# Patient Record
Sex: Male | Born: 1965 | Race: White | Hispanic: No | Marital: Married
Health system: Southern US, Community
[De-identification: ages and names within clinical notes are randomized; demographics above are authoritative.]

## PROBLEM LIST (undated history)

## (undated) DIAGNOSIS — L570 Actinic keratosis: Secondary | ICD-10-CM

## (undated) HISTORY — DX: Actinic keratosis: L57.0

---

## 2005-08-21 ENCOUNTER — Ambulatory Visit: Payer: Self-pay | Admitting: Urology

## 2006-06-19 ENCOUNTER — Ambulatory Visit: Payer: Self-pay | Admitting: Urology

## 2006-07-26 ENCOUNTER — Emergency Department: Payer: Self-pay | Admitting: Emergency Medicine

## 2007-01-15 ENCOUNTER — Ambulatory Visit: Payer: Self-pay | Admitting: Urology

## 2007-10-10 ENCOUNTER — Ambulatory Visit: Payer: Self-pay | Admitting: Urology

## 2008-10-06 ENCOUNTER — Ambulatory Visit: Payer: Self-pay | Admitting: Urology

## 2009-10-12 ENCOUNTER — Ambulatory Visit: Payer: Self-pay | Admitting: Urology

## 2009-12-30 ENCOUNTER — Emergency Department: Payer: Self-pay | Admitting: Emergency Medicine

## 2010-02-20 ENCOUNTER — Ambulatory Visit: Payer: Self-pay | Admitting: Urology

## 2010-03-30 ENCOUNTER — Ambulatory Visit: Payer: Self-pay | Admitting: Urology

## 2010-04-06 ENCOUNTER — Ambulatory Visit: Payer: Self-pay | Admitting: Urology

## 2010-10-16 ENCOUNTER — Ambulatory Visit: Payer: Self-pay | Admitting: Urology

## 2011-08-08 ENCOUNTER — Ambulatory Visit: Payer: Self-pay | Admitting: Urology

## 2011-08-23 ENCOUNTER — Ambulatory Visit: Payer: Self-pay | Admitting: Urology

## 2011-09-06 ENCOUNTER — Ambulatory Visit: Payer: Self-pay | Admitting: Urology

## 2011-11-01 ENCOUNTER — Ambulatory Visit: Payer: Self-pay | Admitting: Urology

## 2011-11-22 ENCOUNTER — Ambulatory Visit: Payer: Self-pay | Admitting: Urology

## 2012-03-17 ENCOUNTER — Ambulatory Visit: Payer: Self-pay | Admitting: Urology

## 2012-10-10 ENCOUNTER — Ambulatory Visit: Payer: Self-pay | Admitting: Urology

## 2012-10-23 ENCOUNTER — Ambulatory Visit: Payer: Self-pay | Admitting: Urology

## 2012-11-04 ENCOUNTER — Ambulatory Visit: Payer: Self-pay | Admitting: Urology

## 2013-05-21 ENCOUNTER — Ambulatory Visit: Payer: Self-pay | Admitting: Urology

## 2013-08-10 IMAGING — CR DG ABDOMEN 1V
1 series · 2 of 2 positions shown · non-contrast
Comparison: none

REASON FOR EXAM: Calculus
COMMENTS:

PROCEDURE:     DXR - DXR KIDNEY URETER BLADDER  - October 23, 2012  [DATE]
RESULT:     Comparison: 10/10/2012

[Series 1: supine kub · 0.17mm/px · 2 of 2 slices shown]
[im 1/2]
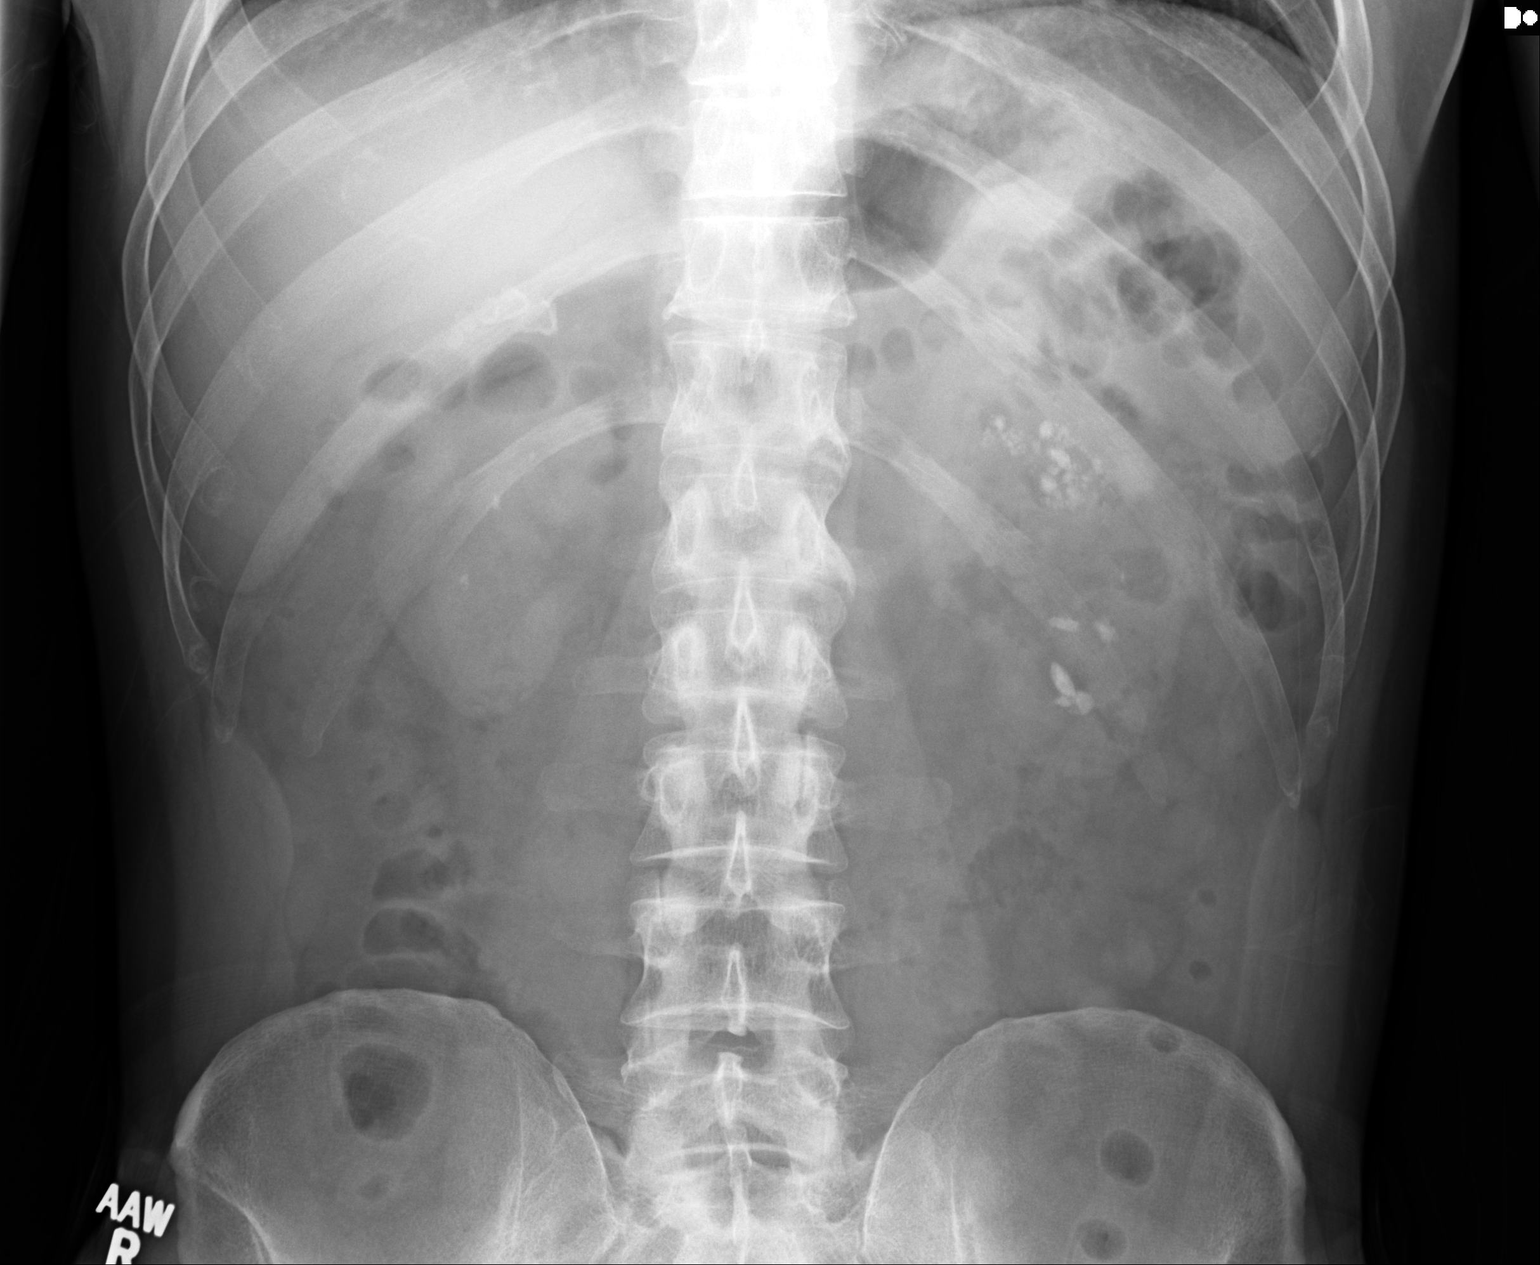
[im 2/2]
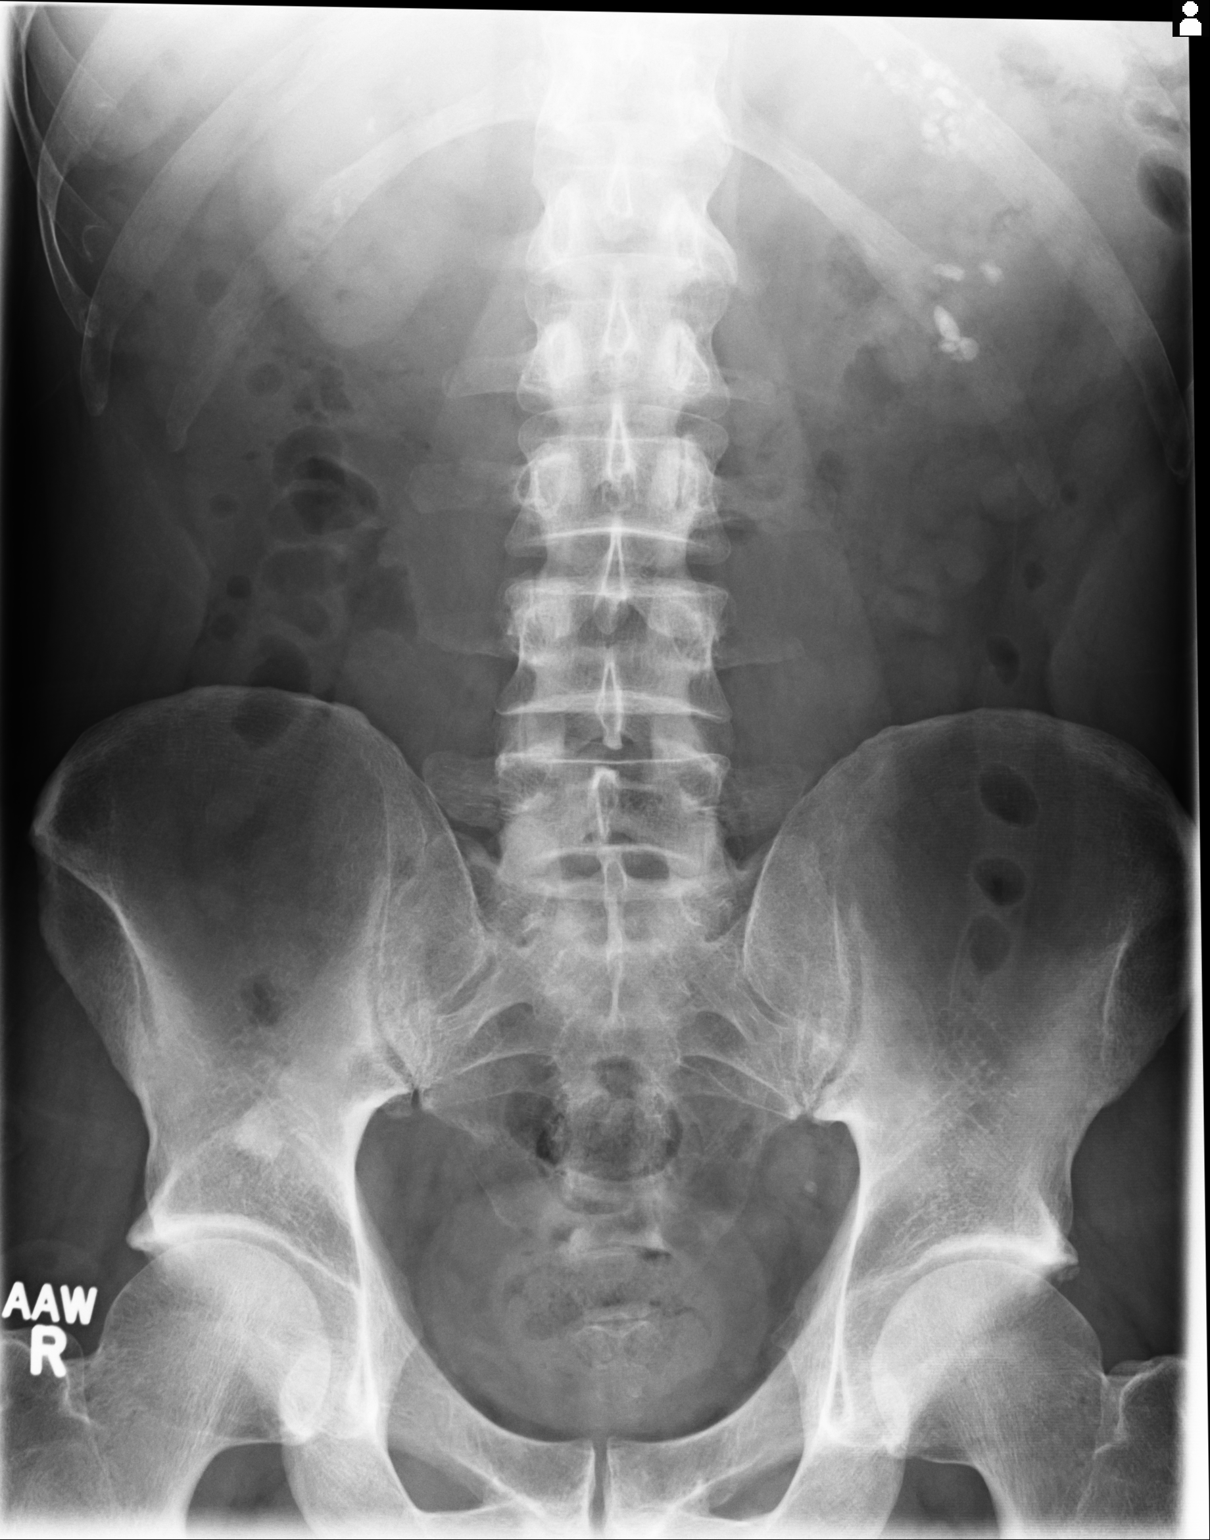

[2 of 2 positions shown; findings below may reference images not displayed]

FINDINGS: Multiple calculi seen overlying the kidneys, left greater than right. There
is one calculus in the region of the left renal pelvis on the prior
examination which is not definitely appreciated on today's examination. The
are calcified gallstones. Calcification in the left hemipelvis likely
represents a phlebolith. Sclerotic density along the right iliac wing likely
represents a bone island.
IMPRESSION: Bilateral nephrolithiasis.

[REDACTED]

## 2013-09-23 ENCOUNTER — Ambulatory Visit: Payer: Self-pay | Admitting: Urology

## 2014-03-08 IMAGING — CR DG ABDOMEN 1V
1 series · 2 of 2 positions shown · non-contrast
Comparison: 10/10/2012

CLINICAL DATA: History of nephrolithiasis since 2595. Left-sided
renal colic

EXAM:
ABDOMEN - 1 VIEW

[Series 1: t abdomen supine · 0.14mm/px · 2 of 2 slices shown]
[im 1/2]
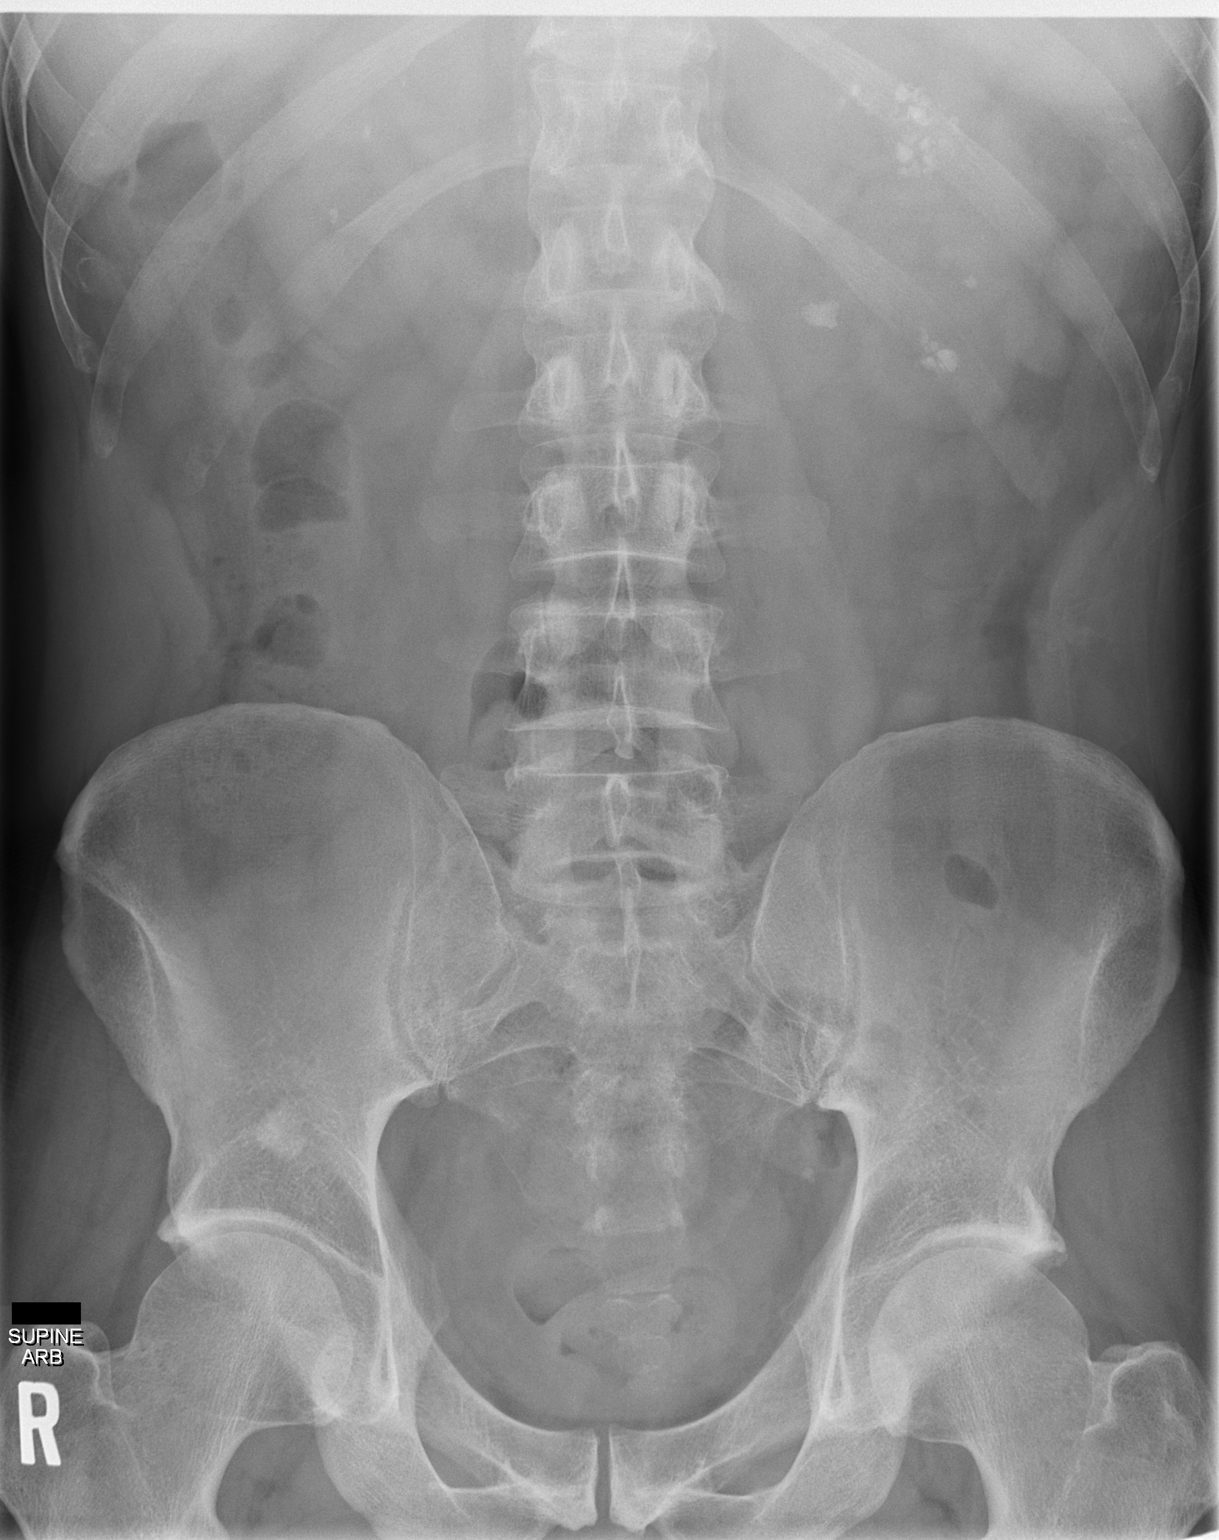
[im 2/2]
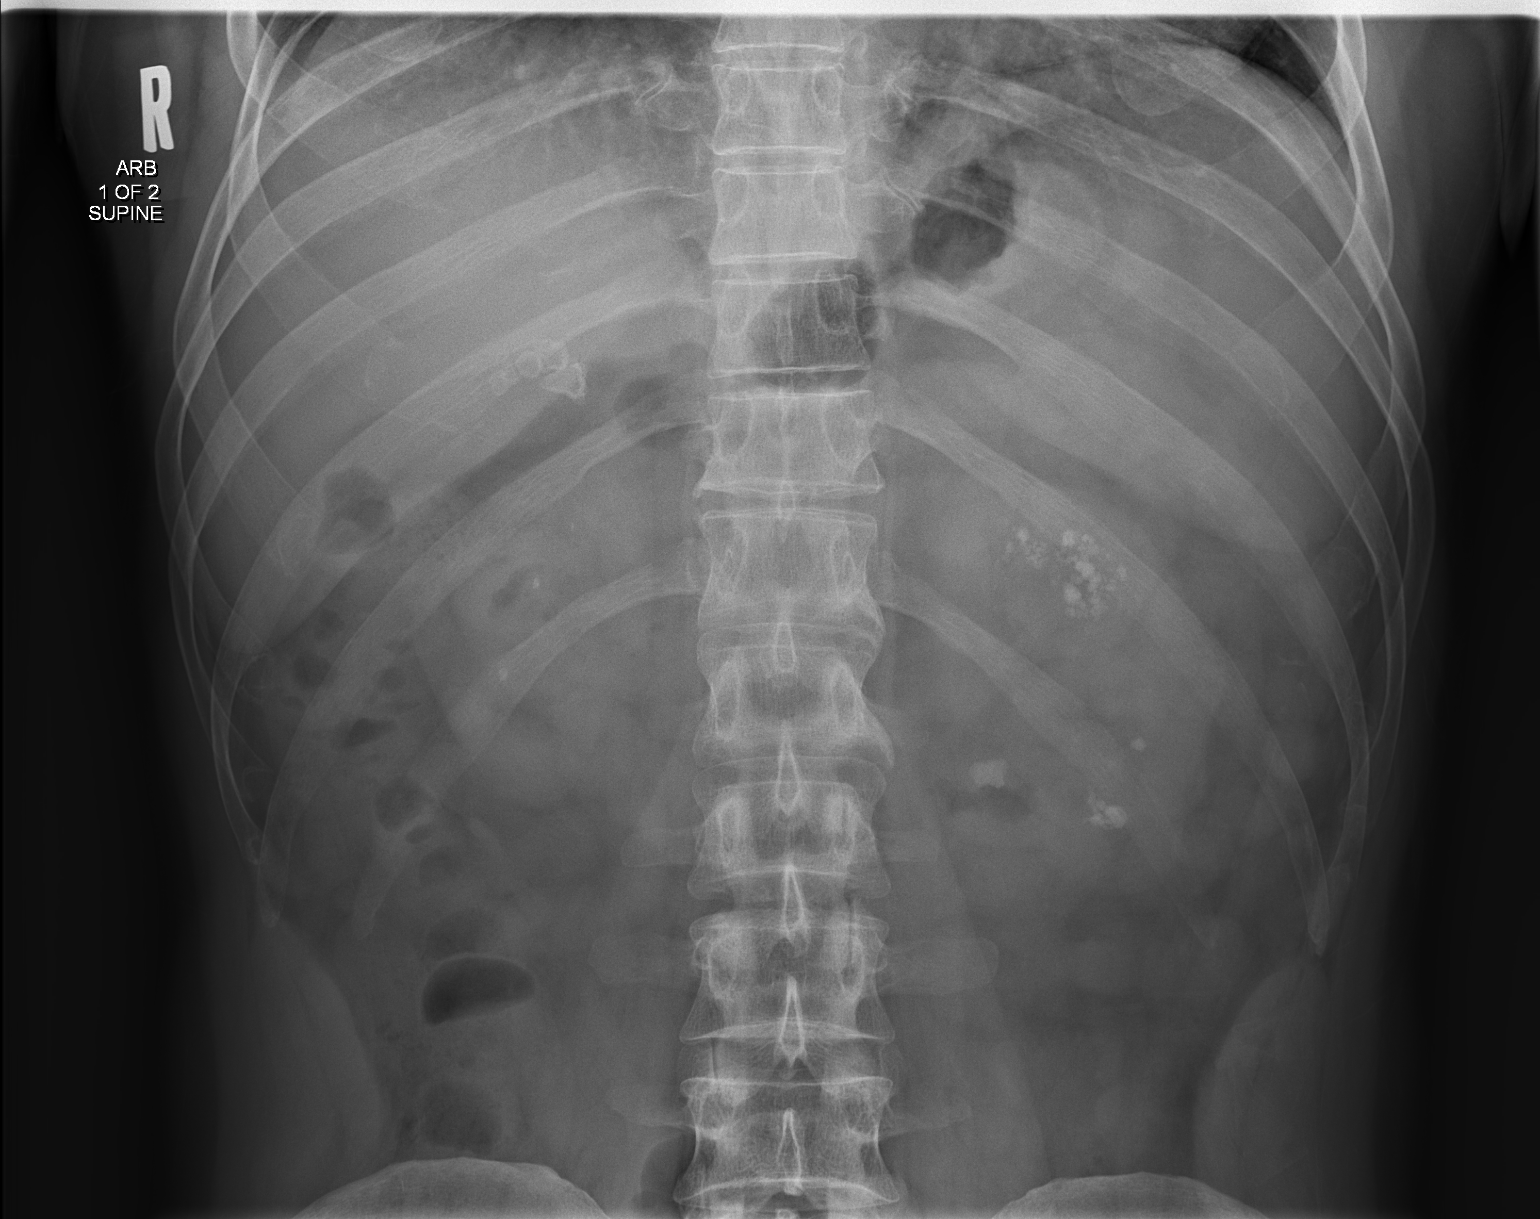

[2 of 2 positions shown; findings below may reference images not displayed]

FINDINGS: The bowel gas pattern is normal. Multiple bilateral renal calculi
are again identified. The majority of these are unchanged in size
and appearance. One calculus positioned in the region of the left
renal pelvis today measures 11 mm as compared with the prior exam in
Tuesday October, 2012 at which time it measured 6.3 mm.

No findings suggesting left ureteral radiopaque calculi are
otherwise seen. A stable right iliac bone island and left pelvic
phlebolith are identified. Bony structures are intact.
IMPRESSION: Increasing size of a left calculus positioned in the expected region
of the left renal pelvis. If clinical concern for the possibility of
an obstructive process at the left UPJ exists as a result of the
increasing size of the stone, further assessment with unenhanced
renal CT would be recommended. Otherwise unchanged multiple
bilateral intrarenal calculi. No ureteral calculi seen on either
side.

## 2014-06-10 ENCOUNTER — Ambulatory Visit: Payer: Self-pay | Admitting: Unknown Physician Specialty

## 2020-01-20 ENCOUNTER — Ambulatory Visit: Payer: BC Managed Care – PPO | Admitting: Dermatology

## 2020-01-20 ENCOUNTER — Other Ambulatory Visit: Payer: Self-pay

## 2020-01-20 DIAGNOSIS — L570 Actinic keratosis: Secondary | ICD-10-CM | POA: Diagnosis not present

## 2020-01-20 DIAGNOSIS — D229 Melanocytic nevi, unspecified: Secondary | ICD-10-CM | POA: Diagnosis not present

## 2020-01-20 DIAGNOSIS — Z1283 Encounter for screening for malignant neoplasm of skin: Secondary | ICD-10-CM | POA: Diagnosis not present

## 2020-01-20 DIAGNOSIS — D2339 Other benign neoplasm of skin of other parts of face: Secondary | ICD-10-CM | POA: Diagnosis not present

## 2020-01-20 DIAGNOSIS — L814 Other melanin hyperpigmentation: Secondary | ICD-10-CM

## 2020-01-20 DIAGNOSIS — D1800 Hemangioma unspecified site: Secondary | ICD-10-CM

## 2020-01-20 DIAGNOSIS — L821 Other seborrheic keratosis: Secondary | ICD-10-CM

## 2020-01-20 DIAGNOSIS — D2239 Melanocytic nevi of other parts of face: Secondary | ICD-10-CM

## 2020-01-20 DIAGNOSIS — L578 Other skin changes due to chronic exposure to nonionizing radiation: Secondary | ICD-10-CM

## 2020-01-20 NOTE — Patient Instructions (Addendum)

## 2020-01-20 NOTE — Progress Notes (Signed)
   Follow-Up Visit   Subjective  Billy Cruz is a 54 y.o. male who presents for the following: Annual Exam (1 year mole check )  The patient presents for Total-Body Skin Exam (TBSE) for skin cancer screening and mole check.  The following portions of the chart were reviewed this encounter and updated as appropriate:  Allergies  Meds  Problems  Med Hx  Surg Hx  Fam Hx     Review of Systems:  No other skin or systemic complaints except as noted in HPI or Assessment and Plan.  Objective  Well appearing patient in no apparent distress; mood and affect are within normal limits.  A full examination was performed including scalp, head, eyes, ears, nose, lips, neck, chest, axillae, abdomen, back, buttocks, bilateral upper extremities, bilateral lower extremities, hands, feet, fingers, toes, fingernails, and toenails. All findings within normal limits unless otherwise noted below.  Objective  L alar rim: 0.2 cm flesh papule   Objective  R ear x 1: Erythematous thin papules/macules with gritty scale.    Assessment & Plan  Fibrous papule of nose L alar rim  Benign-appearing.  Observation.  Call clinic for new or changing moles.  Recommend daily use of broad spectrum spf 30+ sunscreen to sun-exposed areas.    AK (actinic keratosis) R ear x 1  Destruction of lesion - R ear x 1 Complexity: simple   Destruction method: cryotherapy   Informed consent: discussed and consent obtained   Timeout:  patient name, date of birth, surgical site, and procedure verified Lesion destroyed using liquid nitrogen: Yes   Region frozen until ice ball extended beyond lesion: Yes   Outcome: patient tolerated procedure well with no complications   Post-procedure details: wound care instructions given    Skin cancer screening   Lentigines - Scattered tan macules - Discussed due to sun exposure - Benign, observe - Call for any changes  Seborrheic Keratoses - Stuck-on, waxy, tan-brown  papules and plaques  - Discussed benign etiology and prognosis. - Observe - Call for any changes  Melanocytic Nevi - Tan-brown and/or pink-flesh-colored symmetric macules and papules - Benign appearing on exam today - Observation - Call clinic for new or changing moles - Recommend daily use of broad spectrum spf 30+ sunscreen to sun-exposed areas.   Hemangiomas - Red papules - Discussed benign nature - Observe - Call for any changes  Actinic Damage - diffuse scaly erythematous macules with underlying dyspigmentation - Recommend daily broad spectrum sunscreen SPF 30+ to sun-exposed areas, reapply every 2 hours as needed.  - Call for new or changing lesions.  Skin cancer screening performed today.  Return in about 1 year (around 01/19/2021) for TBSE .  IMarye Round, CMA, am acting as scribe for Sarina Ser, MD .  Documentation: I have reviewed the above documentation for accuracy and completeness, and I agree with the above.  Sarina Ser, MD

## 2020-01-26 ENCOUNTER — Encounter: Payer: Self-pay | Admitting: Dermatology

## 2021-01-19 ENCOUNTER — Other Ambulatory Visit: Payer: Self-pay

## 2021-01-19 ENCOUNTER — Ambulatory Visit: Payer: BC Managed Care – PPO | Admitting: Dermatology

## 2021-01-19 DIAGNOSIS — Z1283 Encounter for screening for malignant neoplasm of skin: Secondary | ICD-10-CM | POA: Diagnosis not present

## 2021-01-19 DIAGNOSIS — D229 Melanocytic nevi, unspecified: Secondary | ICD-10-CM

## 2021-01-19 DIAGNOSIS — D2239 Melanocytic nevi of other parts of face: Secondary | ICD-10-CM

## 2021-01-19 DIAGNOSIS — L578 Other skin changes due to chronic exposure to nonionizing radiation: Secondary | ICD-10-CM

## 2021-01-19 DIAGNOSIS — D235 Other benign neoplasm of skin of trunk: Secondary | ICD-10-CM

## 2021-01-19 DIAGNOSIS — C4362 Malignant melanoma of left upper limb, including shoulder: Secondary | ICD-10-CM

## 2021-01-19 DIAGNOSIS — D489 Neoplasm of uncertain behavior, unspecified: Secondary | ICD-10-CM

## 2021-01-19 DIAGNOSIS — L259 Unspecified contact dermatitis, unspecified cause: Secondary | ICD-10-CM | POA: Diagnosis not present

## 2021-01-19 DIAGNOSIS — B354 Tinea corporis: Secondary | ICD-10-CM | POA: Diagnosis not present

## 2021-01-19 DIAGNOSIS — L814 Other melanin hyperpigmentation: Secondary | ICD-10-CM

## 2021-01-19 DIAGNOSIS — D1800 Hemangioma unspecified site: Secondary | ICD-10-CM

## 2021-01-19 DIAGNOSIS — D239 Other benign neoplasm of skin, unspecified: Secondary | ICD-10-CM

## 2021-01-19 DIAGNOSIS — R21 Rash and other nonspecific skin eruption: Secondary | ICD-10-CM

## 2021-01-19 DIAGNOSIS — L821 Other seborrheic keratosis: Secondary | ICD-10-CM

## 2021-01-19 DIAGNOSIS — C439 Malignant melanoma of skin, unspecified: Secondary | ICD-10-CM

## 2021-01-19 HISTORY — DX: Malignant melanoma of skin, unspecified: C43.9

## 2021-01-19 NOTE — Progress Notes (Signed)
Follow-Up Visit   Subjective  Billy Cruz is a 55 y.o. male who presents for the following: Annual Exam (Patient here today for full body exam. He reports 2 weeks ago he went on trip and came back with spot at left forearm that is itchy and red. He has been apply lotrimine to affected area but it has not resolved. Patient reports another red mark at left forearm that he noticed 2 days ago. ).  Patient here for full body skin exam and skin cancer screening.  The following portions of the chart were reviewed this encounter and updated as appropriate:  Allergies  Meds  Problems  Med Hx  Surg Hx  Fam Hx     Objective  Well appearing patient in no apparent distress; mood and affect are within normal limits.  A full examination was performed including scalp, head, eyes, ears, nose, lips, neck, chest, axillae, abdomen, back, buttocks, bilateral upper extremities, bilateral lower extremities, hands, feet, fingers, toes, fingernails, and toenails. All findings within normal limits unless otherwise noted below.  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   Apexicon sample given to use at rash at left forearm twice daily.   Lot [id number]  Exp 01/2021  Neoplasm of uncertain behavior left mid dorsal forearm Epidermal / dermal shaving  Lesion diameter (cm):  1.1 Informed consent: discussed and consent obtained   Timeout: patient name, date of birth, surgical site, and procedure verified   Procedure prep:  Patient was prepped and draped in usual sterile fashion Prep type:  Isopropyl alcohol Anesthesia: the lesion was anesthetized in a standard fashion   Anesthetic:  0.5% bupivicaine w/ epinephrine 1-100,000 local  infiltration Instrument used: flexible razor blade   Hemostasis achieved with: pressure, aluminum chloride and electrodesiccation   Outcome: patient tolerated procedure well   Post-procedure details: sterile dressing applied and wound care instructions given   Dressing type: bandage and petrolatum    Specimen 1 - Surgical pathology Differential Diagnosis: R/o isk vs ca   Check Margins: No R/o isk vs ca   Fibrous papule of nose right alar rim Benign-appearing.  Observation.  Call clinic for new or changing lesions.  Recommend daily use of broad spectrum spf 30+ sunscreen to sun-exposed areas.   Blue nevus left buttocks Blue nevus vs vascular lesion Benign-appearing.  Observation.  Call clinic for new or changing lesions.  Recommend daily use of broad spectrum spf 30+ sunscreen to sun-exposed areas.   Skin cancer screening  Lentigines - Scattered tan macules - Due to sun exposure - Benign-appering, observe - Recommend daily broad spectrum sunscreen SPF 30+ to sun-exposed areas, reapply every 2 hours as needed. - Call for any changes  Seborrheic Keratoses - Stuck-on, waxy, tan-brown papules and/or plaques  - Benign-appearing - Discussed benign etiology and prognosis. - Observe - Call for any changes  Melanocytic Nevi - Tan-brown and/or pink-flesh-colored symmetric macules and papules - Benign appearing on exam today - Observation - Call clinic for new or changing moles - Recommend daily use of broad spectrum spf 30+ sunscreen to sun-exposed areas.   Hemangiomas - Red papules - Discussed benign nature - Observe - Call for any changes  Actinic Damage - Chronic condition, secondary to cumulative UV/sun exposure - diffuse scaly erythematous macules with underlying dyspigmentation - Recommend daily broad spectrum sunscreen SPF 30+ to sun-exposed areas, reapply every 2 hours as needed.  - Staying in the shade or wearing long sleeves, sun glasses (UVA+UVB protection) and  wide brim hats (4-inch brim around the entire circumference of the hat) are also recommended for sun protection.  - Call for new or changing lesions.  Skin cancer screening performed today.  Return in about 1 year (around 01/19/2022) for tbse. IRuthell Rummage, CMA, am acting as scribe for Sarina Ser, MD. Documentation: I have reviewed the above documentation for accuracy and completeness, and I agree with the above.  Sarina Ser, MD

## 2021-01-19 NOTE — Patient Instructions (Addendum)
Biopsy Wound Care Instructions  Leave the original bandage on for 24 hours if possible.  If the bandage becomes soaked or soiled before that time, it is OK to remove it and examine the wound.  A small amount of post-operative bleeding is normal.  If excessive bleeding occurs, remove the bandage, place gauze over the site and apply continuous pressure (no peeking) over the area for 30 minutes. If this does not work, please call our clinic as soon as possible or page your doctor if it is after hours.   Once a day, cleanse the wound with soap and water. It is fine to shower. If a thick crust develops you may use a Q-tip dipped into dilute hydrogen peroxide (mix 1:1 with water) to dissolve it.  Hydrogen peroxide can slow the healing process, so use it only as needed.    After washing, apply petroleum jelly (Vaseline) or an antibiotic ointment if your doctor prescribed one for you, followed by a bandage.    For best healing, the wound should be covered with a layer of ointment at all times. If you are not able to keep the area covered with a bandage to hold the ointment in place, this may mean re-applying the ointment several times a day.  Continue this wound care until the wound has healed and is no longer open.   Itching and mild discomfort is normal during the healing process. However, if you develop pain or severe itching, please call our office.   If you have any discomfort, you can take Tylenol (acetaminophen) or ibuprofen as directed on the bottle. (Please do not take these if you have an allergy to them or cannot take them for another reason).  Some redness, tenderness and white or yellow material in the wound is normal healing.  If the area becomes very sore and red, or develops a thick yellow-green material (pus), it may be infected; please notify us.    If you have stitches, return to clinic as directed to have the stitches removed. You will continue wound care for 2-3 days after the stitches  are removed.   Wound healing continues for up to one year following surgery. It is not unusual to experience pain in the scar from time to time during the interval.  If the pain becomes severe or the scar thickens, you should notify the office.    A slight amount of redness in a scar is expected for the first six months.  After six months, the redness will fade and the scar will soften and fade.  The color difference becomes less noticeable with time.  If there are any problems, return for a post-op surgery check at your earliest convenience.  To improve the appearance of the scar, you can use silicone scar gel, cream, or sheets (such as Mederma or Serica) every night for up to one year. These are available over the counter (without a prescription).  Please call our office at [phone] for any questions or concerns.       Melanoma ABCDEs  Melanoma is the most dangerous type of skin cancer, and is the leading cause of death from skin disease.  You are more likely to develop melanoma if you: Have light-colored skin, light-colored eyes, or red or blond hair Spend a lot of time in the sun Tan regularly, either outdoors or in a tanning bed Have had blistering sunburns, especially during childhood Have a close family member who has had a melanoma Have atypical moles  or large birthmarks  Early detection of melanoma is key since treatment is typically straightforward and cure rates are extremely high if we catch it early.   The first sign of melanoma is often a change in a mole or a new dark spot.  The ABCDE system is a way of remembering the signs of melanoma.  A for asymmetry:  The two halves do not match. B for border:  The edges of the growth are irregular. C for color:  A mixture of colors are present instead of an even brown color. D for diameter:  Melanomas are usually (but not always) greater than 47m - the size of a pencil eraser. E for evolution:  The spot keeps changing in  size, shape, and color.  Please check your skin once per month between visits. You can use a small mirror in front and a large mirror behind you to keep an eye on the back side or your body.   If you see any new or changing lesions before your next follow-up, please call to schedule a visit.  Please continue daily skin protection including broad spectrum sunscreen SPF 30+ to sun-exposed areas, reapplying every 2 hours as needed when you're outdoors.   Staying in the shade or wearing long sleeves, sun glasses (UVA+UVB protection) and wide brim hats (4-inch brim around the entire circumference of the hat) are also recommended for sun protection.    If you have any questions or concerns for your doctor, please call our main line at [phone] press option 4 to reach your doctor's medical assistant. If no one answers, please leave a voicemail as directed and we will return your call as soon as possible. Messages left after 4 pm will be answered the following business day.   You may also send [place] message via [place] We typically respond to MyChart messages within 1-2 business days.  For prescription refills, please ask your pharmacy to contact our office. Our fax number is [phone]  If you have an urgent issue when the clinic is closed that cannot wait until the next business day, you can page your doctor at the number below.    Please note that while we do our best to be available for urgent issues outside of office hours, we are not available 24/7.   If you have an urgent issue and are unable to reach [place] you may choose to seek medical care at your doctor's office, retail clinic, urgent care center, or emergency room.  If you have a medical emergency, please immediately call 911 or go to the emergency department.  Pager Numbers  - Dr. KNehemiah Massed [phone] - Dr. MLaurence Ferrari [phone] - Dr. SNicole Kindred [phone] In the event of inclement weather, please call our main line at  [phone] an update on the status of any delays or closures.  Dermatology Medication Tips: Please keep the boxes that topical medications come in in order to help keep track of the instructions about where and how to use these. Pharmacies typically print the medication instructions only on the boxes and not directly on the medication tubes.   If your medication is too expensive, please contact our office at [phone] 4 or send [place] message through [place]   We are unable to tell what your co-pay for medications will be in advance as this is different depending on your insurance coverage. However, we may be able to find a substitute medication at lower cost or fill  out paperwork to get insurance to cover a needed medication.   If a prior authorization is required to get your medication covered by your insurance company, please allow [place] 1-2 business days to complete this process.  Drug prices often vary depending on where the prescription is filled and some pharmacies may offer cheaper prices.  The website www.goodrx.com contains coupons for medications through different pharmacies. The prices here do not account for what the cost may be with help from insurance (it may be cheaper with your insurance), but the website can give you the price if you did not use any insurance.  - You can print the associated coupon and take it with your prescription to the pharmacy.  - You may also stop by our office during regular business hours and pick up a GoodRx coupon card.  - If you need your prescription sent electronically to a different pharmacy, notify our office through [facility] or by phone at [phone] option 4.

## 2021-01-23 ENCOUNTER — Encounter: Payer: Self-pay | Admitting: Dermatology

## 2021-01-26 ENCOUNTER — Encounter: Payer: Self-pay | Admitting: Dermatology

## 2021-01-26 ENCOUNTER — Telehealth: Payer: Self-pay

## 2021-01-26 NOTE — Telephone Encounter (Signed)
Dr. Nehemiah Massed advised patient of results and he is scheduled for [facility] 02/28/2021/hd

## 2021-01-26 NOTE — Telephone Encounter (Signed)
-----   Message from Ralene Bathe, MD sent at 01/26/2021 10:20 AM EDT ----- Diagnosis Skin , left mid dorsal forearm MALIGNANT MELANOMA MELANOMA TABLE (AJCC 8TH EDITION#) PROCEDURE: NOT SPECIFIED SPECIMEN ANATOMIC SITE: LEFT MID DORSAL FOREARM HISTOLOGIC TYPE: SUPERFICIAL SPREADING BRESLOW'S DEPTH/MAXIMUM TUMOR THICKNESS: 0.2 MM CLARK/ANATOMIC LEVEL: II MARGINS PERIPHERAL MARGINS: INVOLVED DEEP MARGIN: FREE ULCERATION: ABSENT SATELLITOSIS: ABSENT MITOTIC INDEX: <1/MM2 (0) LYMPHO-VASCULAR INVASION: ABSENT NEUROTROPISM: ABSENT TUMOR-INFILTRATING LYMPHOCYTES: NON-BRISK TUMOR REGRESSION: ABSENT LYMPH NODES (IF APPLICABLE): N/A PATHOLOGIC STAGE: PT1A COMMENT: A COMPLETE RE-EXCISION IS RECOMMENDED.  Cancer - Melanoma - invasive, but "thin" Schedule for Melanoma Matrix discussion and Surgery for Wide Local Excision Discussed with pt. By phone today. Will schedule for 02/28/21 at 2:30 pm  Discuss w pt and send original biopsy for [facility] testing (or we can discuss at next visit)

## 2021-02-28 ENCOUNTER — Ambulatory Visit: Payer: BC Managed Care – PPO | Admitting: Dermatology

## 2021-02-28 ENCOUNTER — Other Ambulatory Visit: Payer: Self-pay

## 2021-02-28 DIAGNOSIS — C439 Malignant melanoma of skin, unspecified: Secondary | ICD-10-CM

## 2021-02-28 DIAGNOSIS — C4362 Malignant melanoma of left upper limb, including shoulder: Secondary | ICD-10-CM

## 2021-02-28 MED ORDER — MUPIROCIN 2 % EX OINT
1.0000 | TOPICAL_OINTMENT | Freq: Every day | CUTANEOUS | 0 refills | Status: DC
Start: 2021-02-28 — End: 2021-06-15

## 2021-02-28 NOTE — Patient Instructions (Signed)
Wound Care Instructions  Cleanse wound gently with soap and water once a day then pat dry with clean gauze. Apply a thing coat of Petrolatum (petroleum jelly, "Vaseline") over the wound (unless you have an allergy to this). We recommend that you use a new, sterile tube of Vaseline. Do not pick or remove scabs. Do not remove the yellow or white "healing tissue" from the base of the wound.  Cover the wound with fresh, clean, nonstick gauze and secure with paper tape. You may use Band-Aids in place of gauze and tape if the would is small enough, but would recommend trimming much of the tape off as there is often too much. Sometimes Band-Aids can irritate the skin.  You should call the office for your biopsy report after 1 week if you have not already been contacted.  If you experience any problems, such as abnormal amounts of bleeding, swelling, significant bruising, significant pain, or evidence of infection, please call the office immediately.  FOR ADULT SURGERY PATIENTS: If you need something for pain relief you may take 1 extra strength Tylenol (acetaminophen) AND 2 Ibuprofen (200mg each) together every 4 hours as needed for pain. (do not take these if you are allergic to them or if you have a reason you should not take them.) Typically, you may only need pain medication for 1 to 3 days.   If you have any questions or concerns for your doctor, please call our main line at [phone] and press option 4 to reach your doctor's medical assistant. If no one answers, please leave a voicemail as directed and we will return your call as soon as possible. Messages left after 4 pm will be answered the following business day.   You may also send us a message via MyChart. We typically respond to MyChart messages within 1-2 business days.  For prescription refills, please ask your pharmacy to contact our office. Our fax number is [phone].  If you have an urgent issue when the clinic is closed that  cannot wait until the next business day, you can page your doctor at the number below.    Please note that while we do our best to be available for urgent issues outside of office hours, we are not available 24/7.   If you have an urgent issue and are unable to reach us, you may choose to seek medical care at your doctor's office, retail clinic, urgent care center, or emergency room.  If you have a medical emergency, please immediately call 911 or go to the emergency department.  Pager Numbers  - Dr. Kowalski: [phone]  - Dr. Moye: [phone]  - Dr. Stewart: [phone]  In the event of inclement weather, please call our main line at [phone] for an update on the status of any delays or closures.  Dermatology Medication Tips: Please keep the boxes that topical medications come in in order to help keep track of the instructions about where and how to use these. Pharmacies typically print the medication instructions only on the boxes and not directly on the medication tubes.   If your medication is too expensive, please contact our office at [phone] option 4 or send us a message through MyChart.   We are unable to tell what your co-pay for medications will be in advance as this is different depending on your insurance coverage. However, we may be able to find a substitute medication at lower cost or fill out paperwork to get insurance to cover a needed   medication.   If a prior authorization is required to get your medication covered by your insurance company, please allow us 1-2 business days to complete this process.  Drug prices often vary depending on where the prescription is filled and some pharmacies may offer cheaper prices.  The website www.goodrx.com contains coupons for medications through different pharmacies. The prices here do not account for what the cost may be with help from insurance (it may be cheaper with your insurance), but the website can give you the  price if you did not use any insurance.  - You can print the associated coupon and take it with your prescription to the pharmacy.  - You may also stop by our office during regular business hours and pick up a GoodRx coupon card.  - If you need your prescription sent electronically to a different pharmacy, notify our office through [place] MyChart or by phone at [phone] option 4.   

## 2021-02-28 NOTE — Progress Notes (Signed)
Follow-Up Visit   Subjective  Billy Cruz is a 55 y.o. 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  The following portions of the chart were reviewed this encounter and updated as appropriate:   Allergies  Meds  Problems  Med Hx  Surg Hx  Fam Hx     Review of Systems:  No other skin or systemic complaints except as noted in HPI or Assessment and Plan.  Objective  Well appearing patient in no apparent distress; mood and affect are within normal limits.  A focused examination was performed including left arm. Relevant physical exam findings are noted in the Assessment and Plan.  Left mid dorsum forearm Healing biopsy site   Assessment & Plan  Melanoma of skin ([place]) Left mid dorsum forearm  Skin excision  Lesion length (cm):  1.5 Lesion width (cm):  1.5 Margin per side (cm):  1 Total excision diameter (cm):  3.5 Informed consent: discussed and consent obtained   Timeout: patient name, date of birth, surgical site, and procedure verified   Procedure prep:  Patient was prepped and draped in usual sterile fashion Prep type:  Isopropyl alcohol and povidone-iodine Anesthesia: the lesion was anesthetized in a standard fashion   Anesthetic:  1% lidocaine w/ epinephrine 1-100,000 buffered w/ 8.4% NaHCO3 Instrument used: #15 blade   Hemostasis achieved with: pressure   Hemostasis achieved with comment:  Electrocautery Outcome: patient tolerated procedure well with no complications   Post-procedure details: sterile dressing applied and wound care instructions given   Dressing type: bandage and pressure dressing (mupirocin)    Skin repair Complexity:  Intermediate Final length (cm):  3.5 Informed consent: discussed and consent obtained   Reason for type of repair: reduce tension to allow closure, reduce the risk of dehiscence, infection, and necrosis, reduce subcutaneous dead space and  avoid a hematoma, allow closure of the large defect, preserve normal anatomy, preserve normal anatomical and functional relationships and enhance both functionality and cosmetic results   Undermining: area extensively undermined   Undermining comment:  Undermining defect 0.5cm Subcutaneous layers (deep stitches):  Suture size:  2-0 Suture type: Vicryl (polyglactin 910)   Subcutaneous suture technique: purse string. Fine/surface layer approximation (top stitches):  Suture type: nylon   Suture type comment:  Nylon Stitches: simple running   Suture removal (days):  7 Hemostasis achieved with: suture and pressure Hemostasis achieved with comment:  Electrocautery Outcome: patient tolerated procedure well with no complications   Post-procedure details: sterile dressing applied and wound care instructions given   Dressing type: bandage and pressure dressing (mupirocin)    mupirocin ointment (BACTROBAN) 2 % Apply 1 application topically daily. With dressing changes  Specimen 1 - Surgical pathology Differential Diagnosis: Biopsy proven Malignant Melanoma Check Margins: Yes [phone] Suture at proximal 12 oclock edge  Discussed diagnosis in detail including significance of melanoma diagnosis which can be potentially lethal.  Discussed treatment recommendations in detail advising that treatment recommendations are based on longitudinal studies and retrospective studies and are nationwide protocols.  Advised there is always potential for recurrence even after definitive treatment.  After definitive treatment, we recommend total-body skin exams every 3 months for a year; then every 4 months for a year; then every 6 months for 3 years.  At 5 years post treatment, if all looks good we would recommend at least yearly total-body skin exams for the rest of your life.  The patient was given time  for questions and these were answered.  We recommend frequent self skin examinations; photoprotection with  sunscreen, sun protective clothing, hats, sunglasses and sun avoidance.  If the patient notices any new or changing skin lesions the patient should return to the office immediately for evaluation.   We will plan [facility] testing.  Start Mupirocin oint qd with dressing change.  Return in about 1 week (around 03/07/2021) for suture removal.  I, Ashok Cordia, CMA, am acting as scribe for Sarina Ser, MD .  Documentation: I have reviewed the above documentation for accuracy and completeness, and I agree with the above.  Sarina Ser, MD

## 2021-03-01 ENCOUNTER — Telehealth: Payer: Self-pay

## 2021-03-01 NOTE — Telephone Encounter (Signed)
Spoke with patient regarding surgery. He is doing fine/hd  

## 2021-03-02 ENCOUNTER — Encounter: Payer: Self-pay | Admitting: Dermatology

## 2021-03-07 ENCOUNTER — Other Ambulatory Visit: Payer: Self-pay

## 2021-03-07 ENCOUNTER — Encounter: Payer: Self-pay | Admitting: Dermatology

## 2021-03-07 ENCOUNTER — Ambulatory Visit (INDEPENDENT_AMBULATORY_CARE_PROVIDER_SITE_OTHER): Payer: BC Managed Care – PPO | Admitting: Dermatology

## 2021-03-07 DIAGNOSIS — Z86006 Personal history of melanoma in-situ: Secondary | ICD-10-CM

## 2021-03-07 DIAGNOSIS — Z85820 Personal history of malignant melanoma of skin: Secondary | ICD-10-CM

## 2021-03-07 NOTE — Patient Instructions (Signed)

## 2021-03-07 NOTE — Progress Notes (Signed)
   Follow-Up Visit   Subjective  Billy Cruz is a 55 y.o. male who presents for the following: Suture / Staple Removal (1 week f/u left mid dorsum forearm Malignant Melanoma invasive excised 02/28/21).  The following portions of the chart were reviewed this encounter and updated as appropriate:   Allergies  Meds  Problems  Med Hx  Surg Hx  Fam Hx     Review of Systems:  No other skin or systemic complaints except as noted in HPI or Assessment and Plan.  Objective  Well appearing patient in no apparent distress; mood and affect are within normal limits.  A focused examination was performed including face,left arm. Relevant physical exam findings are noted in the Assessment and Plan.  left mid dorsum forearm Well healed scar with no evidence of recurrence.    Assessment & Plan  Personal history of malignant melanoma of skin left mid dorsum forearm  Biopsy results discussed Malignant Melanoma invasive-clear margins   History of Malignant Melanoma invasive  - No evidence of recurrence today - No lymphadenopathy - Recommend regular full body skin exams - Recommend daily broad spectrum sunscreen SPF 30+ to sun-exposed areas, reapply every 2 hours as needed.  - Call if any new or changing lesions are noted between office visits   Awaiting results for [place].  Return in about 3 months (around 06/07/2021) for TBSE, hx of Melanoma .  IMarye Round, CMA, am acting as scribe for Sarina Ser, MD .  Documentation: I have reviewed the above documentation for accuracy and completeness, and I agree with the above.  Sarina Ser, MD

## 2021-03-14 ENCOUNTER — Encounter: Payer: Self-pay | Admitting: Dermatology

## 2021-03-14 ENCOUNTER — Telehealth: Payer: Self-pay

## 2021-03-14 NOTE — Telephone Encounter (Signed)
Discussed [facility] results with pt, results scanned in this chart

## 2021-06-15 ENCOUNTER — Ambulatory Visit: Payer: BC Managed Care – PPO | Admitting: Dermatology

## 2021-06-15 ENCOUNTER — Other Ambulatory Visit: Payer: Self-pay

## 2021-06-15 DIAGNOSIS — L578 Other skin changes due to chronic exposure to nonionizing radiation: Secondary | ICD-10-CM

## 2021-06-15 DIAGNOSIS — Z1283 Encounter for screening for malignant neoplasm of skin: Secondary | ICD-10-CM | POA: Diagnosis not present

## 2021-06-15 DIAGNOSIS — L821 Other seborrheic keratosis: Secondary | ICD-10-CM

## 2021-06-15 DIAGNOSIS — H109 Unspecified conjunctivitis: Secondary | ICD-10-CM

## 2021-06-15 DIAGNOSIS — D229 Melanocytic nevi, unspecified: Secondary | ICD-10-CM

## 2021-06-15 DIAGNOSIS — L814 Other melanin hyperpigmentation: Secondary | ICD-10-CM

## 2021-06-15 DIAGNOSIS — Z85820 Personal history of malignant melanoma of skin: Secondary | ICD-10-CM | POA: Diagnosis not present

## 2021-06-15 DIAGNOSIS — D1800 Hemangioma unspecified site: Secondary | ICD-10-CM

## 2021-06-15 NOTE — Progress Notes (Signed)
° °  Follow-Up Visit   Subjective  Billy Cruz is a 56 y.o. male who presents for the following: Follow-up (Patient here today for follow up on history of melanoma at left forearm. Patient is here for tbse. ). The patient presents for Total-Body Skin Exam (TBSE) for skin cancer screening and mole check.  The patient has spots, moles and lesions to be evaluated, some may be new or changing and the patient has concerns that these could be cancer.  The following portions of the chart were reviewed this encounter and updated as appropriate:  Allergies   Meds   Problems   Med Hx   Surg Hx   Fam Hx      Review of Systems: No other skin or systemic complaints except as noted in HPI or Assessment and Plan.  Objective  Well appearing patient in no apparent distress; mood and affect are within normal limits.  A full examination was performed including scalp, head, eyes, ears, nose, lips, neck, chest, axillae, abdomen, back, buttocks, bilateral upper extremities, bilateral lower extremities, hands, feet, fingers, toes, fingernails, and toenails. All findings within normal limits unless otherwise noted below.  Right Lower Eyelid Redness at right lower eyelid   Assessment & Plan  Conjunctivitis of right eye Right Lower Eyelid Patient reports was painting and got some paint in eye.  It is improving now. Patient defers treatment and states area is getting better.   Skin cancer screening  Lentigines - Scattered tan macules - Due to sun exposure - Benign-appearing, observe - Recommend daily broad spectrum sunscreen SPF 30+ to sun-exposed areas, reapply every 2 hours as needed. - Call for any changes  Seborrheic Keratoses - Stuck-on, waxy, tan-brown papules and/or plaques  - Benign-appearing - Discussed benign etiology and prognosis. - Observe - Call for any changes  Melanocytic Nevi - Tan-brown and/or pink-flesh-colored symmetric macules and papules - Benign appearing on exam today -  Observation - Call clinic for new or changing moles - Recommend daily use of broad spectrum spf 30+ sunscreen to sun-exposed areas.   Hemangiomas - Red papules - Discussed benign nature - Observe - Call for any changes  Actinic Damage - Chronic condition, secondary to cumulative UV/sun exposure - diffuse scaly erythematous macules with underlying dyspigmentation - Recommend daily broad spectrum sunscreen SPF 30+ to sun-exposed areas, reapply every 2 hours as needed.  - Staying in the shade or wearing long sleeves, sun glasses (UVA+UVB protection) and wide brim hats (4-inch brim around the entire circumference of the hat) are also recommended for sun protection.  - Call for new or changing lesions.  History of Melanoma - No evidence of recurrence today left mid dorsal forearm excised 02/28/21 - No lymphadenopathy - Recommend regular full body skin exams - Recommend daily broad spectrum sunscreen SPF 30+ to sun-exposed areas, reapply every 2 hours as needed.  - Call if any new or changing lesions are noted between office visits  Skin cancer screening performed today.  Return for 3 month tbse h/o melanoma .  IRuthell Rummage, CMA, am acting as scribe for Sarina Ser, MD. Documentation: I have reviewed the above documentation for accuracy and completeness, and I agree with the above.  Sarina Ser, MD

## 2021-06-15 NOTE — Patient Instructions (Signed)
Melanoma ABCDEs ? ?Melanoma is the most dangerous type of skin cancer, and is the leading cause of death from skin disease.  You are more likely to develop melanoma if you: ?Have light-colored skin, light-colored eyes, or red or blond hair ?Spend a lot of time in the sun ?Tan regularly, either outdoors or in a tanning bed ?Have had blistering sunburns, especially during childhood ?Have a close family member who has had a melanoma ?Have atypical moles or large birthmarks ? ?Early detection of melanoma is key since treatment is typically straightforward and cure rates are extremely high if we catch it early.  ? ?The first sign of melanoma is often a change in a mole or a new dark spot.  The ABCDE system is a way of remembering the signs of melanoma. ? ?A for asymmetry:  The two halves do not match. ?B for border:  The edges of the growth are irregular. ?C for color:  A mixture of colors are present instead of an even brown color. ?D for diameter:  Melanomas are usually (but not always) greater than 6mm - the size of a pencil eraser. ?E for evolution:  The spot keeps changing in size, shape, and color. ? ?Please check your skin once per month between visits. You can use a small mirror in front and a large mirror behind you to keep an eye on the back side or your body.  ? ?If you see any new or changing lesions before your next follow-up, please call to schedule a visit. ? ?Please continue daily skin protection including broad spectrum sunscreen SPF 30+ to sun-exposed areas, reapplying every 2 hours as needed when you're outdoors.  ? ?Staying in the shade or wearing long sleeves, sun glasses (UVA+UVB protection) and wide brim hats (4-inch brim around the entire circumference of the hat) are also recommended for sun protection.   ? ? ?If You Need Anything After Your Visit ? ?If you have any questions or concerns for your doctor, please call our main line at [phone] and press option 4 to reach your doctor's medical  assistant. If no one answers, please leave a voicemail as directed and we will return your call as soon as possible. Messages left after 4 pm will be answered the following business day.  ? ?You may also send us a message via MyChart. We typically respond to MyChart messages within 1-2 business days. ? ?For prescription refills, please ask your pharmacy to contact our office. Our fax number is [phone]. ? ?If you have an urgent issue when the clinic is closed that cannot wait until the next business day, you can page your doctor at the number below.   ? ?Please note that while we do our best to be available for urgent issues outside of office hours, we are not available 24/7.  ? ?If you have an urgent issue and are unable to reach us, you may choose to seek medical care at your doctor's office, retail clinic, urgent care center, or emergency room. ? ?If you have a medical emergency, please immediately call 911 or go to the emergency department. ? ?Pager Numbers ? ?- Dr. Kowalski: [phone] ? ?- Dr. Moye: [phone] ? ?- Dr. Stewart: [phone] ? ?In the event of inclement weather, please call our main line at [phone] for an update on the status of any delays or closures. ? ?Dermatology Medication Tips: ?Please keep the boxes that topical medications come in in order to help keep track of the instructions   about where and how to use these. Pharmacies typically print the medication instructions only on the boxes and not directly on the medication tubes.  ? ?If your medication is too expensive, please contact our office at [phone] option 4 or send us a message through MyChart.  ? ?We are unable to tell what your co-pay for medications will be in advance as this is different depending on your insurance coverage. However, we may be able to find a substitute medication at lower cost or fill out paperwork to get insurance to cover a needed medication.  ? ?If a prior authorization is required to get your  medication covered by your insurance company, please allow us 1-2 business days to complete this process. ? ?Drug prices often vary depending on where the prescription is filled and some pharmacies may offer cheaper prices. ? ?The website www.goodrx.com contains coupons for medications through different pharmacies. The prices here do not account for what the cost may be with help from insurance (it may be cheaper with your insurance), but the website can give you the price if you did not use any insurance.  ?- You can print the associated coupon and take it with your prescription to the pharmacy.  ?- You may also stop by our office during regular business hours and pick up a GoodRx coupon card.  ?- If you need your prescription sent electronically to a different pharmacy, notify our office through [facility] MyChart or by phone at [phone] option 4. ? ? ? ? ?Si Usted Necesita Algo Despu?s de Su Visita ? ?Tambi?n puede enviarnos un mensaje a trav?s de MyChart. Por lo general respondemos a los mensajes de MyChart en el transcurso de 1 a 2 d?as h?biles. ? ?Para renovar recetas, por favor pida a su farmacia que se ponga en contacto con nuestra oficina. Nuestro n?mero de fax es el [phone]. ? ?Si tiene un asunto urgente cuando la cl?nica est? cerrada y que no puede esperar hasta el siguiente d?a h?bil, puede llamar/localizar a su doctor(a) al n?mero que aparece a continuaci?n.  ? ?Por favor, tenga en cuenta que aunque hacemos todo lo posible para estar disponibles para asuntos urgentes fuera del horario de oficina, no estamos disponibles las 24 horas del d?a, los 7 d?as de la semana.  ? ?Si tiene un problema urgente y no puede comunicarse con nosotros, puede optar por buscar atenci?n m?dica  en el consultorio de su doctor(a), en una cl?nica privada, en un centro de atenci?n urgente o en una sala de emergencias. ? ?Si tiene una emergencia m?dica, por favor llame inmediatamente al 911 o vaya a la sala de  emergencias. ? ?N?meros de b?per ? ?- Dr. Kowalski: [phone] ? ?- Dra. Moye: [phone] ? ?- Dra. Stewart: [phone] ? ?En caso de inclemencias del tiempo, por favor llame a nuestra l?nea principal al [phone] para una actualizaci?n sobre el estado de cualquier retraso o cierre. ? ?Consejos para la medicaci?n en dermatolog?a: ?Por favor, guarde las cajas en las que vienen los medicamentos de uso t?pico para ayudarle a seguir las instrucciones sobre d?nde y c?mo usarlos. Las farmacias generalmente imprimen las instrucciones del medicamento s?lo en las cajas y no directamente en los tubos del medicamento.  ? ?Si su medicamento es muy caro, por favor, p?ngase en contacto con nuestra oficina llamando al [phone] y presione la opci?n 4 o env?enos un mensaje a trav?s de MyChart.  ? ?No podemos decirle cu?l ser? su copago por los medicamentos por adelantado ya que   esto es diferente dependiendo de la cobertura de su seguro. Sin embargo, es posible que podamos encontrar un medicamento sustituto a menor costo o llenar un formulario para que el seguro cubra el medicamento que se considera necesario.  ? ?Si se requiere una autorizaci?n previa para que su compa??a de seguros cubra su medicamento, por favor perm?tanos de 1 a 2 d?as h?biles para completar este proceso. ? ?Los precios de los medicamentos var?an con frecuencia dependiendo del lugar de d?nde se surte la receta y alguna farmacias pueden ofrecer precios m?s baratos. ? ?El sitio web www.goodrx.com tiene cupones para medicamentos de diferentes farmacias. Los precios aqu? no tienen en cuenta lo que podr?a costar con la ayuda del seguro (puede ser m?s barato con su seguro), pero el sitio web puede darle el precio si no utiliz? ning?n seguro.  ?- Puede imprimir el cup?n correspondiente y llevarlo con su receta a la farmacia.  ?- Tambi?n puede pasar por nuestra oficina durante el horario de atenci?n regular y recoger una tarjeta de cupones de GoodRx.  ?- Si  necesita que su receta se env?e electr?nicamente a una farmacia diferente, informe a nuestra oficina a trav?s de MyChart de [place] o por tel?fono llamando al [phone] y presione la opci?n 4. ? ?

## 2021-06-19 ENCOUNTER — Encounter: Payer: Self-pay | Admitting: Dermatology

## 2021-09-14 ENCOUNTER — Encounter: Payer: Self-pay | Admitting: Dermatology

## 2021-09-14 ENCOUNTER — Ambulatory Visit: Payer: BC Managed Care – PPO | Admitting: Dermatology

## 2021-09-14 DIAGNOSIS — L810 Postinflammatory hyperpigmentation: Secondary | ICD-10-CM | POA: Diagnosis not present

## 2021-09-14 DIAGNOSIS — L578 Other skin changes due to chronic exposure to nonionizing radiation: Secondary | ICD-10-CM

## 2021-09-14 DIAGNOSIS — D229 Melanocytic nevi, unspecified: Secondary | ICD-10-CM

## 2021-09-14 DIAGNOSIS — L570 Actinic keratosis: Secondary | ICD-10-CM | POA: Diagnosis not present

## 2021-09-14 DIAGNOSIS — Z1283 Encounter for screening for malignant neoplasm of skin: Secondary | ICD-10-CM

## 2021-09-14 DIAGNOSIS — Z85820 Personal history of malignant melanoma of skin: Secondary | ICD-10-CM

## 2021-09-14 DIAGNOSIS — D1800 Hemangioma unspecified site: Secondary | ICD-10-CM

## 2021-09-14 DIAGNOSIS — L814 Other melanin hyperpigmentation: Secondary | ICD-10-CM

## 2021-09-14 DIAGNOSIS — L821 Other seborrheic keratosis: Secondary | ICD-10-CM

## 2021-09-14 NOTE — Progress Notes (Signed)
? ?Follow-Up Visit ?  ?Subjective  ?Billy Cruz is a 56 y.o. male who presents for the following: Annual Exam (3 month TBSE. Hx MM, superficial spreading, tumor thickness 0.70m, anatomic level II, left mid dorsal forearm). ?The patient presents for Total-Body Skin Exam (TBSE) for skin cancer screening and mole check.  The patient has spots, moles and lesions to be evaluated, some may be new or changing and the patient has concerns that these could be cancer. ? ?The following portions of the chart were reviewed this encounter and updated as appropriate:  Allergies  Meds  Problems  Med Hx  Surg Hx  Fam Hx   ?  ?Review of Systems: No other skin or systemic complaints except as noted in HPI or Assessment and Plan. ? ?Objective  ?Well appearing patient in no apparent distress; mood and affect are within normal limits. ? ?A full examination was performed including scalp, head, eyes, ears, nose, lips, neck, chest, axillae, abdomen, back, buttocks, bilateral upper extremities, bilateral lower extremities, hands, feet, fingers, toes, fingernails, and toenails. All findings within normal limits unless otherwise noted below. ? ?Left Ear x1 ?Erythematous thin papules/macules with gritty scale.  ? ?Left Knee - Anterior ?Violaceous macule ? ? ?Assessment & Plan  ? ?Lentigines ?- Scattered tan macules ?- Due to sun exposure ?- Benign-appearing, observe ?- Recommend daily broad spectrum sunscreen SPF 30+ to sun-exposed areas, reapply every 2 hours as needed. ?- Call for any changes ? ?Seborrheic Keratoses ?- Stuck-on, waxy, tan-brown papules and/or plaques  ?- Benign-appearing ?- Discussed benign etiology and prognosis. ?- Observe ?- Call for any changes ? ?Melanocytic Nevi ?- Tan-brown and/or pink-flesh-colored symmetric macules and papules ?- Benign appearing on exam today ?- Observation ?- Call clinic for new or changing moles ?- Recommend daily use of broad spectrum spf 30+ sunscreen to sun-exposed areas.   ? ?Hemangiomas ?- Red papules ?- Discussed benign nature ?- Observe ?- Call for any changes ? ?Actinic Damage ?- Chronic condition, secondary to cumulative UV/sun exposure ?- diffuse scaly erythematous macules with underlying dyspigmentation ?- Recommend daily broad spectrum sunscreen SPF 30+ to sun-exposed areas, reapply every 2 hours as needed.  ?- Staying in the shade or wearing long sleeves, sun glasses (UVA+UVB protection) and wide brim hats (4-inch brim around the entire circumference of the hat) are also recommended for sun protection.  ?- Call for new or changing lesions. ? ?History of Melanoma. Superficial spreading, tumor thickness 0.244m anatomic level II. Left mid dorsal forearm. Bx: 01/2021. Excised: 02/28/2021 ?[place] [facility] ?- No evidence of recurrence today ?- No lymphadenopathy ?- Recommend regular full body skin exams ?- Recommend daily broad spectrum sunscreen SPF 30+ to sun-exposed areas, reapply every 2 hours as needed.  ?- Call if any new or changing lesions are noted between office visits  ? ?Skin cancer screening performed today. ? ?AK (actinic keratosis) ?Left Ear x1 ?Actinic keratoses are precancerous spots that appear secondary to cumulative UV radiation exposure/sun exposure over time. They are chronic with expected duration over 1 year. A portion of actinic keratoses will progress to squamous cell carcinoma of the skin. It is not possible to reliably predict which spots will progress to skin cancer and so treatment is recommended to prevent development of skin cancer. ? ?Recommend daily broad spectrum sunscreen SPF 30+ to sun-exposed areas, reapply every 2 hours as needed.  ?Recommend staying in the shade or wearing long sleeves, sun glasses (UVA+UVB protection) and wide brim hats (4-inch brim around  the entire circumference of the hat). ?Call for new or changing lesions. ? ?Destruction of lesion - Left Ear x1 ?Complexity: simple   ?Destruction method: cryotherapy    ?Informed consent: discussed and consent obtained   ?Timeout:  patient name, date of birth, surgical site, and procedure verified ?Lesion destroyed using liquid nitrogen: Yes   ?Region frozen until ice ball extended beyond lesion: Yes   ?Outcome: patient tolerated procedure well with no complications   ?Post-procedure details: wound care instructions given   ? ?Post-inflammatory hyperpigmentation ?Left Knee - Anterior ?Benign-appearing.  Observation.  Call clinic for new or changing lesions.  Recommend daily use of broad spectrum spf 30+ sunscreen to sun-exposed areas.   ? ?Skin cancer screening ? ?Return for TBSE As Scheduled, Hx MM. ? ?I, Emelia Salisbury, CMA, am acting as scribe for Sarina Ser, MD. ?Documentation: I have reviewed the above documentation for accuracy and completeness, and I agree with the above. ? ?Sarina Ser, MD ? ? ?

## 2021-09-14 NOTE — Patient Instructions (Addendum)
Cryotherapy Aftercare ? ?Wash gently with soap and water everyday.   ?Apply Vaseline and Band-Aid daily until healed.  ? ?Prior to procedure, discussed risks of blister formation, small wound, skin dyspigmentation, or rare scar following cryotherapy. Recommend Vaseline ointment to treated areas while healing.  ? ? ?Recommend daily broad spectrum sunscreen SPF 30+ to sun-exposed areas, reapply every 2 hours as needed. Call for new or changing lesions.  ?Staying in the shade or wearing long sleeves, sun glasses (UVA+UVB protection) and wide brim hats (4-inch brim around the entire circumference of the hat) are also recommended for sun protection.   ? ? ?Melanoma ABCDEs ? ?Melanoma is the most dangerous type of skin cancer, and is the leading cause of death from skin disease.  You are more likely to develop melanoma if you: ?Have light-colored skin, light-colored eyes, or red or blond hair ?Spend a lot of time in the sun ?Tan regularly, either outdoors or in a tanning bed ?Have had blistering sunburns, especially during childhood ?Have a close family member who has had a melanoma ?Have atypical moles or large birthmarks ? ?Early detection of melanoma is key since treatment is typically straightforward and cure rates are extremely high if we catch it early.  ? ?The first sign of melanoma is often a change in a mole or a new dark spot.  The ABCDE system is a way of remembering the signs of melanoma. ? ?A for asymmetry:  The two halves do not match. ?B for border:  The edges of the growth are irregular. ?C for color:  A mixture of colors are present instead of an even brown color. ?D for diameter:  Melanomas are usually (but not always) greater than 48m - the size of a pencil eraser. ?E for evolution:  The spot keeps changing in size, shape, and color. ? ?Please check your skin once per month between visits. You can use a small mirror in front and a large mirror behind you to keep an eye on the back side or your body.   ? ?If you see any new or changing lesions before your next follow-up, please call to schedule a visit. ? ?Please continue daily skin protection including broad spectrum sunscreen SPF 30+ to sun-exposed areas, reapplying every 2 hours as needed when you're outdoors.  ? ?Staying in the shade or wearing long sleeves, sun glasses (UVA+UVB protection) and wide brim hats (4-inch brim around the entire circumference of the hat) are also recommended for sun protection.   ? ?If You Need Anything After Your Visit ? ?If you have any questions or concerns for your doctor, please call our main line at [phone] press option 4 to reach your doctor's medical assistant. If no one answers, please leave a voicemail as directed and we will return your call as soon as possible. Messages left after 4 pm will be answered the following business day.  ? ?You may also send [place] message via MyChart. We typically respond to MyChart messages within 1-2 business days. ? ?For prescription refills, please ask your pharmacy to contact our office. Our fax number is [phone] ? ?If you have an urgent issue when the clinic is closed that cannot wait until the next business day, you can page your doctor at the number below.   ? ?Please note that while we do our best to be available for urgent issues outside of office hours, we are not available 24/7.  ? ?If you have an urgent issue and  are unable to reach [place], you may choose to seek medical care at your doctor's office, retail clinic, urgent care center, or emergency room. ? ?If you have a medical emergency, please immediately call 911 or go to the emergency department. ? ?Pager Numbers ? ?- Dr. Nehemiah Massed: [phone] ? ?- Dr. Laurence Ferrari: [phone] ? ?- Dr. Nicole Kindred: [phone] ? ?In the event of inclement weather, please call our main line at [phone] for an update on the status of any delays or closures. ? ?Dermatology Medication Tips: ?Please keep the boxes that topical medications  come in in order to help keep track of the instructions about where and how to use these. Pharmacies typically print the medication instructions only on the boxes and not directly on the medication tubes.  ? ?If your medication is too expensive, please contact our office at [phone] option 4 or send [place] a message through [place].  ? ?We are unable to tell what your co-pay for medications will be in advance as this is different depending on your insurance coverage. However, we may be able to find a substitute medication at lower cost or fill out paperwork to get insurance to cover a needed medication.  ? ?If a prior authorization is required to get your medication covered by your insurance company, please allow [place] 1-2 business days to complete this process. ? ?Drug prices often vary depending on where the prescription is filled and some pharmacies may offer cheaper prices. ? ?The website www.goodrx.com contains coupons for medications through different pharmacies. The prices here do not account for what the cost may be with help from insurance (it may be cheaper with your insurance), but the website can give you the price if you did not use any insurance.  ?- You can print the associated coupon and take it with your prescription to the pharmacy.  ?- You may also stop by our office during regular business hours and pick up a GoodRx coupon card.  ?- If you need your prescription sent electronically to a different pharmacy, notify our office through [facility] or by phone at [phone] option 4. ? ? ? ? ?Si Usted Necesita Algo Despu?s de Su Visita ? ?Tambi?n puede enviarnos un mensaje a trav?s de MyChart. Por lo general respondemos a los mensajes de MyChart en el transcurso de 1 a 2 d?as h?biles. ? ?Para renovar recetas, por favor pida a su farmacia que se ponga en contacto con nuestra oficina. Nuestro n?mero de fax es el [phone]. ? ?Si tiene un asunto urgente cuando la cl?nica est? cerrada y que no  puede esperar hasta el siguiente d?a h?bil, puede llamar/localizar a su doctor(a) al n?mero que aparece a continuaci?n.  ? ?Por favor, tenga en cuenta que aunque hacemos todo lo posible para estar disponibles para asuntos urgentes fuera del horario de oficina, no estamos disponibles las 24 horas del d?a, los 7 d?as de la semana.  ? ?Si tiene un problema urgente y no puede comunicarse con nosotros, puede optar por buscar atenci?n m?dica  en el consultorio de su doctor(a), en una cl?nica privada, en un centro de atenci?n urgente o en una sala de emergencias. ? ?Si tiene Engineer, maintenance (IT) m?dica, por favor llame inmediatamente al 911 o vaya a la sala de emergencias. ? ?N?meros de b?per ? ?- Dr. Nehemiah Massed: [phone] ? ?- Dra. Moye: [phone] ? ?- Dra. Nicole Kindred: [phone] ? ?En caso de inclemencias del tiempo, por favor llame a nuestra l?nea principal al [phone] para una actualizaci?n [employer]  estado de cualquier retraso o cierre. ? ?Consejos para la medicaci?n en dermatolog?a: ?Por favor, guarde las cajas en las que vienen los medicamentos de uso t?pico para ayudarle a seguir las instrucciones sobre d?nde y c?mo usarlos. Las farmacias generalmente imprimen las instrucciones del medicamento s?lo en las cajas y no directamente en los tubos del [place].  ? ?Si su medicamento es muy caro, por favor, p?ngase en contacto con Zigmund Daniel llamando al [phone] y presione la opci?n 4 o env?enos un mensaje a trav?s de MyChart.  ? ?No podemos decirle cu?l ser? su copago por los medicamentos por adelantado ya que esto es diferente dependiendo de la cobertura de su seguro. Sin embargo, es posible que podamos encontrar un medicamento sustituto a Electrical engineer un formulario para que el seguro cubra el medicamento que se considera necesario.  ? ?Si se requiere Ardelia Mems autorizaci?n previa para que su compa??a de seguros [place] su medicamento, por favor perm?tanos de 1 a 2 d?as h?biles para completar este  proceso. ? ?Los precios de los medicamentos var?an con frecuencia dependiendo del Environmental consultant de d?nde se surte la receta y alguna farmacias pueden ofrecer precios m?s baratos. ? ?El sitio web www.goodrx.com tiene cupones p

## 2021-09-22 ENCOUNTER — Encounter: Payer: Self-pay | Admitting: Dermatology

## 2022-01-25 ENCOUNTER — Ambulatory Visit: Payer: BC Managed Care – PPO | Admitting: Dermatology

## 2022-01-25 DIAGNOSIS — L814 Other melanin hyperpigmentation: Secondary | ICD-10-CM

## 2022-01-25 DIAGNOSIS — Z1283 Encounter for screening for malignant neoplasm of skin: Secondary | ICD-10-CM | POA: Diagnosis not present

## 2022-01-25 DIAGNOSIS — L578 Other skin changes due to chronic exposure to nonionizing radiation: Secondary | ICD-10-CM | POA: Diagnosis not present

## 2022-01-25 DIAGNOSIS — D1800 Hemangioma unspecified site: Secondary | ICD-10-CM

## 2022-01-25 DIAGNOSIS — D229 Melanocytic nevi, unspecified: Secondary | ICD-10-CM

## 2022-01-25 DIAGNOSIS — L821 Other seborrheic keratosis: Secondary | ICD-10-CM

## 2022-01-25 DIAGNOSIS — Z85820 Personal history of malignant melanoma of skin: Secondary | ICD-10-CM

## 2022-01-25 NOTE — Progress Notes (Signed)
   Follow-Up Visit   Subjective  Billy Cruz is a 56 y.o. male who presents for the following: Annual Exam (Tbse, hx of ak, hx of melanoma, reports a spot at right forearm he would like checked. ). The patient presents for Total-Body Skin Exam (TBSE) for skin cancer screening and mole check.  The patient has spots, moles and lesions to be evaluated, some may be new or changing and the patient has concerns that these could be cancer.  The following portions of the chart were reviewed this encounter and updated as appropriate:  Allergies  Meds  Problems  Med Hx  Surg Hx  Fam Hx     Review of Systems: No other skin or systemic complaints except as noted in HPI or Assessment and Plan.  Objective  Well appearing patient in no apparent distress; mood and affect are within normal limits.  A full examination was performed including scalp, head, eyes, ears, nose, lips, neck, chest, axillae, abdomen, back, buttocks, bilateral upper extremities, bilateral lower extremities, hands, feet, fingers, toes, fingernails, and toenails. All findings within normal limits unless otherwise noted below.   Assessment & Plan  Lentigines - Scattered tan macules - Due to sun exposure - Benign-appearing, observe - Recommend daily broad spectrum sunscreen SPF 30+ to sun-exposed areas, reapply every 2 hours as needed. - Call for any changes  Seborrheic Keratoses - Stuck-on, waxy, tan-brown papules and/or plaques  - Benign-appearing - Discussed benign etiology and prognosis. - Observe - Call for any changes  Melanocytic Nevi - Tan-brown and/or pink-flesh-colored symmetric macules and papules - Benign appearing on exam today - Observation - Call clinic for new or changing moles - Recommend daily use of broad spectrum spf 30+ sunscreen to sun-exposed areas.   Hemangiomas - Red papules - Discussed benign nature - Observe - Call for any changes  Actinic Damage - Chronic condition, secondary to  cumulative UV/sun exposure - diffuse scaly erythematous macules with underlying dyspigmentation - Recommend daily broad spectrum sunscreen SPF 30+ to sun-exposed areas, reapply every 2 hours as needed.  - Staying in the shade or wearing long sleeves, sun glasses (UVA+UVB protection) and wide brim hats (4-inch brim around the entire circumference of the hat) are also recommended for sun protection.  - Call for new or changing lesions.  History of Melanoma Superficial spreading, tumor thickness 0.23m, anatomic level II. Left mid dorsal forearm. Bx: 01/2021. Excised: 02/28/2021 [place] [facility] A - testing was low risk  - No evidence of recurrence today - No lymphadenopathy - Recommend regular full body skin exams - Recommend daily broad spectrum sunscreen SPF 30+ to sun-exposed areas, reapply every 2 hours as needed.  - Call if any new or changing lesions are noted between office visits  Skin cancer screening performed today.  Return in about 4 months (around 05/27/2022) for TBSE hx of melanoma . I,Ruthell Rummage CMA, am acting as scribe for DSarina Ser MD. Documentation: I have reviewed the above documentation for accuracy and completeness, and I agree with the above.  DSarina Ser MD

## 2022-01-25 NOTE — Patient Instructions (Addendum)
     Melanoma ABCDEs  Melanoma is the most dangerous type of skin cancer, and is the leading cause of death from skin disease.  You are more likely to develop melanoma if you: Have light-colored skin, light-colored eyes, or red or blond hair Spend a lot of time in the sun Tan regularly, either outdoors or in a tanning bed Have had blistering sunburns, especially during childhood Have a close family member who has had a melanoma Have atypical moles or large birthmarks  Early detection of melanoma is key since treatment is typically straightforward and cure rates are extremely high if we catch it early.   The first sign of melanoma is often a change in a mole or a new dark spot.  The ABCDE system is a way of remembering the signs of melanoma.  A for asymmetry:  The two halves do not match. B for border:  The edges of the growth are irregular. C for color:  A mixture of colors are present instead of an even brown color. D for diameter:  Melanomas are usually (but not always) greater than 6mm - the size of a pencil eraser. E for evolution:  The spot keeps changing in size, shape, and color.  Please check your skin once per month between visits. You can use a small mirror in front and a large mirror behind you to keep an eye on the back side or your body.   If you see any new or changing lesions before your next follow-up, please call to schedule a visit.  Please continue daily skin protection including broad spectrum sunscreen SPF 30+ to sun-exposed areas, reapplying every 2 hours as needed when you're outdoors.   Staying in the shade or wearing long sleeves, sun glasses (UVA+UVB protection) and wide brim hats (4-inch brim around the entire circumference of the hat) are also recommended for sun protection.    Due to recent changes in healthcare laws, you may see results of your pathology and/or laboratory studies on MyChart before the doctors have had a chance to review them. We  understand that in some cases there may be results that are confusing or concerning to you. Please understand that not all results are received at the same time and often the doctors may need to interpret multiple results in order to provide you with the best plan of care or course of treatment. Therefore, we ask that you please give us 2 business days to thoroughly review all your results before contacting the office for clarification. Should we see a critical lab result, you will be contacted sooner.   If You Need Anything After Your Visit  If you have any questions or concerns for your doctor, please call our main line at [phone] and press option 4 to reach your doctor's medical assistant. If no one answers, please leave a voicemail as directed and we will return your call as soon as possible. Messages left after 4 pm will be answered the following business day.   You may also send us a message via MyChart. We typically respond to MyChart messages within 1-2 business days.  For prescription refills, please ask your pharmacy to contact our office. Our fax number is [phone].  If you have an urgent issue when the clinic is closed that cannot wait until the next business day, you can page your doctor at the number below.    Please note that while we do our best to be available for urgent issues   outside of office hours, we are not available 24/7.   If you have an urgent issue and are unable to reach us, you may choose to seek medical care at your doctor's office, retail clinic, urgent care center, or emergency room.  If you have a medical emergency, please immediately call 911 or go to the emergency department.  Pager Numbers  - Dr. Kowalski: [phone]  - Dr. Moye: [phone]  - Dr. Stewart: [phone]  In the event of inclement weather, please call our main line at [phone] for an update on the status of any delays or closures.  Dermatology Medication Tips: Please  keep the boxes that topical medications come in in order to help keep track of the instructions about where and how to use these. Pharmacies typically print the medication instructions only on the boxes and not directly on the medication tubes.   If your medication is too expensive, please contact our office at [phone] option 4 or send us a message through MyChart.   We are unable to tell what your co-pay for medications will be in advance as this is different depending on your insurance coverage. However, we may be able to find a substitute medication at lower cost or fill out paperwork to get insurance to cover a needed medication.   If a prior authorization is required to get your medication covered by your insurance company, please allow us 1-2 business days to complete this process.  Drug prices often vary depending on where the prescription is filled and some pharmacies may offer cheaper prices.  The website www.goodrx.com contains coupons for medications through different pharmacies. The prices here do not account for what the cost may be with help from insurance (it may be cheaper with your insurance), but the website can give you the price if you did not use any insurance.  - You can print the associated coupon and take it with your prescription to the pharmacy.  - You may also stop by our office during regular business hours and pick up a GoodRx coupon card.  - If you need your prescription sent electronically to a different pharmacy, notify our office through [place] MyChart or by phone at [phone] option 4.     Si Usted Necesita Algo Despus de Su Visita  Tambin puede enviarnos un mensaje a travs de MyChart. Por lo general respondemos a los mensajes de MyChart en el transcurso de 1 a 2 das hbiles.  Para renovar recetas, por favor pida a su farmacia que se ponga en contacto con nuestra oficina. Nuestro nmero de fax es el [phone].  Si tiene un asunto urgente  cuando la clnica est cerrada y que no puede esperar hasta el siguiente da hbil, puede llamar/localizar a su doctor(a) al nmero que aparece a continuacin.   Por favor, tenga en cuenta que aunque hacemos todo lo posible para estar disponibles para asuntos urgentes fuera del horario de oficina, no estamos disponibles las 24 horas del da, los 7 das de la semana.   Si tiene un problema urgente y no puede comunicarse con nosotros, puede optar por buscar atencin mdica  en el consultorio de su doctor(a), en una clnica privada, en un centro de atencin urgente o en una sala de emergencias.  Si tiene una emergencia mdica, por favor llame inmediatamente al 911 o vaya a la sala de emergencias.  Nmeros de bper  - Dr. Kowalski: [phone]  - Dra. Moye: [phone]  - Dra. Stewart: [phone]  En caso   de inclemencias del tiempo, por favor llame a nuestra lnea principal al [phone] para una actualizacin sobre el estado de cualquier retraso o cierre.  Consejos para la medicacin en dermatologa: Por favor, guarde las cajas en las que vienen los medicamentos de uso tpico para ayudarle a seguir las instrucciones sobre dnde y cmo usarlos. Las farmacias generalmente imprimen las instrucciones del medicamento slo en las cajas y no directamente en los tubos del medicamento.   Si su medicamento es muy caro, por favor, pngase en contacto con nuestra oficina llamando al [phone] y presione la opcin 4 o envenos un mensaje a travs de MyChart.   No podemos decirle cul ser su copago por los medicamentos por adelantado ya que esto es diferente dependiendo de la cobertura de su seguro. Sin embargo, es posible que podamos encontrar un medicamento sustituto a menor costo o llenar un formulario para que el seguro cubra el medicamento que se considera necesario.   Si se requiere una autorizacin previa para que su compaa de seguros cubra su medicamento, por favor permtanos de 1 a 2  das hbiles para completar este proceso.  Los precios de los medicamentos varan con frecuencia dependiendo del lugar de dnde se surte la receta y alguna farmacias pueden ofrecer precios ms baratos.  El sitio web www.goodrx.com tiene cupones para medicamentos de diferentes farmacias. Los precios aqu no tienen en cuenta lo que podra costar con la ayuda del seguro (puede ser ms barato con su seguro), pero el sitio web puede darle el precio si no utiliz ningn seguro.  - Puede imprimir el cupn correspondiente y llevarlo con su receta a la farmacia.  - Tambin puede pasar por nuestra oficina durante el horario de atencin regular y recoger una tarjeta de cupones de GoodRx.  - Si necesita que su receta se enve electrnicamente a una farmacia diferente, informe a nuestra oficina a travs de MyChart de [place] o por telfono llamando al [phone] y presione la opcin 4.  

## 2022-01-28 ENCOUNTER — Encounter: Payer: Self-pay | Admitting: Dermatology

## 2022-05-24 ENCOUNTER — Ambulatory Visit: Payer: BC Managed Care – PPO | Admitting: Dermatology

## 2022-05-24 VITALS — BP 118/81 | HR 94

## 2022-05-24 DIAGNOSIS — Z85820 Personal history of malignant melanoma of skin: Secondary | ICD-10-CM | POA: Diagnosis not present

## 2022-05-24 DIAGNOSIS — L308 Other specified dermatitis: Secondary | ICD-10-CM

## 2022-05-24 DIAGNOSIS — L821 Other seborrheic keratosis: Secondary | ICD-10-CM

## 2022-05-24 DIAGNOSIS — L578 Other skin changes due to chronic exposure to nonionizing radiation: Secondary | ICD-10-CM | POA: Diagnosis not present

## 2022-05-24 DIAGNOSIS — D229 Melanocytic nevi, unspecified: Secondary | ICD-10-CM

## 2022-05-24 DIAGNOSIS — L814 Other melanin hyperpigmentation: Secondary | ICD-10-CM

## 2022-05-24 DIAGNOSIS — Z1283 Encounter for screening for malignant neoplasm of skin: Secondary | ICD-10-CM | POA: Diagnosis not present

## 2022-05-24 NOTE — Patient Instructions (Signed)
Recommend 1% Hydrocortisone cream daily as needed for affected area of neck   Recommend Cerave cream daily to moisturize  Due to recent changes in healthcare laws, you may see results of your pathology and/or laboratory studies on MyChart before the doctors have had a chance to review them. We understand that in some cases there may be results that are confusing or concerning to you. Please understand that not all results are received at the same time and often the doctors may need to interpret multiple results in order to provide you with the best plan of care or course of treatment. Therefore, we ask that you please give [place] 2 business days to thoroughly review all your results before contacting the office for clarification. Should we see a critical lab result, you will be contacted sooner.   If You Need Anything After Your Visit  If you have any questions or concerns for your doctor, please call our main line at [phone] and press option 4 to reach your doctor's medical assistant. If no one answers, please leave a voicemail as directed and we will return your call as soon as possible. Messages left after 4 pm will be answered the following business day.   You may also send [place] a message via [place]. We typically respond to MyChart messages within 1-2 business days.  For prescription refills, please ask your pharmacy to contact our office. Our fax number is [phone].  If you have an urgent issue when the clinic is closed that cannot wait until the next business day, you can page your doctor at the number below.    Please note that while we do our best to be available for urgent issues outside of office hours, we are not available 24/7.   If you have an urgent issue and are unable to reach [place], you may choose to seek medical care at your doctor's office, retail clinic, urgent care center, or emergency room.  If you have a medical emergency, please immediately call 911 or go to the  emergency department.  Pager Numbers  - Dr. Nehemiah Massed: [phone]  - Dr. Laurence Ferrari: [phone]  - Dr. Nicole Kindred: [phone]  In the event of inclement weather, please call our main line at [phone] for an update on the status of any delays or closures.  Dermatology Medication Tips: Please keep the boxes that topical medications come in in order to help keep track of the instructions about where and how to use these. Pharmacies typically print the medication instructions only on the boxes and not directly on the medication tubes.   If your medication is too expensive, please contact our office at [phone] option 4 or send [place] a message through [place].   We are unable to tell what your co-pay for medications will be in advance as this is different depending on your insurance coverage. However, we may be able to find a substitute medication at lower cost or fill out paperwork to get insurance to cover a needed medication.   If a prior authorization is required to get your medication covered by your insurance company, please allow [place] 1-2 business days to complete this process.  Drug prices often vary depending on where the prescription is filled and some pharmacies may offer cheaper prices.  The website www.goodrx.com contains coupons for medications through different pharmacies. The prices here do not account for what the cost may be with help from insurance (it may be cheaper with your insurance), but the website can give you  the price if you did not use any insurance.  - You can print the associated coupon and take it with your prescription to the pharmacy.  - You may also stop by our office during regular business hours and pick up a GoodRx coupon card.  - If you need your prescription sent electronically to a different pharmacy, notify our office through [facility] or by phone at [phone] option 4.     Si Usted Necesita Algo Despus de Su Visita  Tambin puede  enviarnos un mensaje a travs de Pharmacist, community. Por lo general respondemos a los mensajes de MyChart en el transcurso de 1 a 2 das hbiles.  Para renovar recetas, por favor pida a su farmacia que se ponga en contacto con nuestra oficina. Harland Dingwall de fax es [place] [phone].  Si tiene un asunto urgente cuando la clnica est cerrada y que no puede esperar hasta el siguiente da hbil, puede llamar/localizar a su doctor(a) al nmero que aparece a continuacin.   Por favor, tenga en cuenta que aunque hacemos todo lo posible para estar disponibles para asuntos urgentes fuera del horario de [place], no estamos disponibles las 24 horas del da, los 7 das de la [place].   Si tiene un problema urgente y no puede comunicarse con nosotros, puede optar por buscar atencin mdica  en el consultorio de su doctor(a), en una clnica privada, en un centro de atencin urgente o en una sala de emergencias.  Si tiene Engineering geologist, por favor llame inmediatamente al 911 o vaya a la sala de emergencias.  Nmeros de bper  - Dr. Nehemiah Massed: [phone]  - Dra. Moye: [phone]  - Dra. Nicole Kindred: [phone]  En caso de inclemencias del [place], por favor llame a Johnsie Kindred principal al [phone] para una actualizacin sobre el [place] de cualquier retraso o cierre.  Consejos para la medicacin en dermatologa: Por favor, guarde las cajas en las que vienen los medicamentos de uso tpico para ayudarle a seguir las instrucciones sobre dnde y cmo usarlos. Las farmacias generalmente imprimen las instrucciones del medicamento slo en las cajas y no directamente en los tubos del [place].   Si su medicamento es muy caro, por favor, pngase en contacto con Zigmund Daniel llamando al [phone] y presione la opcin 4 o envenos un mensaje a travs de Pharmacist, community.   No podemos decirle cul ser su copago por los medicamentos por adelantado ya que esto es diferente dependiendo de la cobertura de su seguro.  Sin embargo, es posible que podamos encontrar un medicamento sustituto a Electrical engineer un formulario para que el seguro cubra el medicamento que se considera necesario.   Si se requiere una autorizacin previa para que su compaa de seguros [place] su medicamento, por favor permtanos de 1 a 2 das hbiles para completar este proceso.  Los precios de los medicamentos varan con frecuencia dependiendo del Environmental consultant de dnde se surte la receta y alguna farmacias pueden ofrecer precios ms baratos.  El sitio web www.goodrx.com tiene cupones para medicamentos de Airline pilot. Los precios aqu no tienen en cuenta lo que podra costar con la ayuda del seguro (puede ser ms barato con su seguro), pero el sitio web puede darle el precio si no utiliz Research scientist (physical sciences).  - Puede imprimir el cupn correspondiente y llevarlo con su receta a la farmacia.  - Tambin puede pasar por nuestra oficina durante el horario de atencin regular y Charity fundraiser una tarjeta de cupones de GoodRx.  - Si necesita  Si necesita que su receta se enve electrnicamente a una farmacia diferente, informe a nuestra oficina a travs de MyChart de  o por telfono llamando al [phone] y presione la opcin 4.  

## 2022-05-24 NOTE — Progress Notes (Signed)
   Follow-Up Visit   Subjective  Billy Cruz is a 56 y.o. male who presents for the following: Other (History of Melanoma - The patient presents for Total-Body Skin Exam (TBSE) for skin cancer screening and mole check.  The patient has spots, moles and lesions to be evaluated, some may be new or changing and the patient has concerns that these could be cancer./).  The following portions of the chart were reviewed this encounter and updated as appropriate:   Allergies  Meds  Problems  Med Hx  Surg Hx  Fam Hx     Review of Systems:  No other skin or systemic complaints except as noted in HPI or Assessment and Plan.  Objective  Well appearing patient in no apparent distress; mood and affect are within normal limits.  A full examination was performed including scalp, head, eyes, ears, nose, lips, neck, chest, axillae, abdomen, back, buttocks, bilateral upper extremities, bilateral lower extremities, hands, feet, fingers, toes, fingernails, and toenails. All findings within normal limits unless otherwise noted below.  Neck - Posterior Pink patch   Assessment & Plan   History of Melanoma Superficial spreading, tumor thickness 0.6m, anatomic level II. Left mid dorsal forearm. Bx: 01/2021. Excised: 02/28/2021 [place] [facility] A - testing was low risk  - No evidence of recurrence today - No lymphadenopathy - Recommend regular full body skin exams - Recommend daily broad spectrum sunscreen SPF 30+ to sun-exposed areas, reapply every 2 hours as needed.  - Call if any new or changing lesions are noted between office visits  Lentigines - Scattered tan macules - Due to sun exposure - Benign-appearing, observe - Recommend daily broad spectrum sunscreen SPF 30+ to sun-exposed areas, reapply every 2 hours as needed. - Call for any changes  Seborrheic Keratoses - Stuck-on, waxy, tan-brown papules and/or plaques  - Benign-appearing - Discussed benign etiology and prognosis. - Observe -  Call for any changes  Melanocytic Nevi - Tan-brown and/or pink-flesh-colored symmetric macules and papules - Benign appearing on exam today - Observation - Call clinic for new or changing moles - Recommend daily use of broad spectrum spf 30+ sunscreen to sun-exposed areas.   Hemangiomas - Red papules - Discussed benign nature - Observe - Call for any changes  Actinic Damage - Chronic condition, secondary to cumulative UV/sun exposure - diffuse scaly erythematous macules with underlying dyspigmentation - Recommend daily broad spectrum sunscreen SPF 30+ to sun-exposed areas, reapply every 2 hours as needed.  - Staying in the shade or wearing long sleeves, sun glasses (UVA+UVB protection) and wide brim hats (4-inch brim around the entire circumference of the hat) are also recommended for sun protection.  - Call for new or changing lesions.  Skin cancer screening performed today.  Other eczema Neck - Posterior Recommend 1% Hydrocortisone cream qd prn  Recommend Cerave cream daily to moisturize  Return in about 4 months (around 09/23/2022) for TBSE History of Melanoma.  I, HAshok Cordia CMA, am acting as scribe for DSarina Ser MD . Documentation: I have reviewed the above documentation for accuracy and completeness, and I agree with the above.  DSarina Ser MD

## 2022-06-02 ENCOUNTER — Encounter: Payer: Self-pay | Admitting: Dermatology

## 2022-09-27 ENCOUNTER — Ambulatory Visit: Payer: BC Managed Care – PPO | Admitting: Dermatology

## 2022-09-27 ENCOUNTER — Encounter: Payer: Self-pay | Admitting: Dermatology

## 2022-09-27 DIAGNOSIS — L578 Other skin changes due to chronic exposure to nonionizing radiation: Secondary | ICD-10-CM

## 2022-09-27 DIAGNOSIS — Z85820 Personal history of malignant melanoma of skin: Secondary | ICD-10-CM

## 2022-09-27 DIAGNOSIS — L814 Other melanin hyperpigmentation: Secondary | ICD-10-CM

## 2022-09-27 DIAGNOSIS — D229 Melanocytic nevi, unspecified: Secondary | ICD-10-CM

## 2022-09-27 DIAGNOSIS — Z1283 Encounter for screening for malignant neoplasm of skin: Secondary | ICD-10-CM | POA: Diagnosis not present

## 2022-09-27 DIAGNOSIS — I8310 Varicose veins of unspecified lower extremity with inflammation: Secondary | ICD-10-CM

## 2022-09-27 DIAGNOSIS — L821 Other seborrheic keratosis: Secondary | ICD-10-CM

## 2022-09-27 NOTE — Progress Notes (Signed)
   Follow-Up Visit   Subjective  Billy Cruz is a 57 y.o. male who presents for the following: Skin Cancer Screening and Full Body Skin Exam  The patient presents for Total-Body Skin Exam (TBSE) for skin cancer screening and mole check. The patient has spots, moles and lesions to be evaluated, some may be new or changing and the patient has concerns that these could be cancer.  Spot at lower left leg, patient noticed a few days ago. He does have some lower leg pain, possibly sciatica.   Hx MM, left mid dorsal forearm, Clarks Level II, superficial spreading.   The following portions of the chart were reviewed this encounter and updated as appropriate: medications, allergies, medical history  Review of Systems:  No other skin or systemic complaints except as noted in HPI or Assessment and Plan.  Objective  Well appearing patient in no apparent distress; mood and affect are within normal limits.  A full examination was performed including scalp, head, eyes, ears, nose, lips, neck, chest, axillae, abdomen, back, buttocks, bilateral upper extremities, bilateral lower extremities, hands, feet, fingers, toes, fingernails, and toenails. All findings within normal limits unless otherwise noted below.   Relevant physical exam findings are noted in the Assessment and Plan.    Assessment & Plan   LENTIGINES, SEBORRHEIC KERATOSES, HEMANGIOMAS - Benign normal skin lesions - Benign-appearing - Call for any changes  MELANOCYTIC NEVI - Tan-brown and/or pink-flesh-colored symmetric macules and papules - Benign appearing on exam today - Observation - Call clinic for new or changing moles - Recommend daily use of broad spectrum spf 30+ sunscreen to sun-exposed areas.   ACTINIC DAMAGE - Chronic condition, secondary to cumulative UV/sun exposure - diffuse scaly erythematous macules with underlying dyspigmentation - Recommend daily broad spectrum sunscreen SPF 30+ to sun-exposed areas,  reapply every 2 hours as needed.  - Staying in the shade or wearing long sleeves, sun glasses (UVA+UVB protection) and wide brim hats (4-inch brim around the entire circumference of the hat) are also recommended for sun protection.  - Call for new or changing lesions.  SKIN CANCER SCREENING PERFORMED TODAY.  HISTORY OF MELANOMA - left mid dorsal forearm, Clarks Level II, superficial spreading.  - No evidence of recurrence today - No lymphadenopathy - Recommend regular full body skin exams - Recommend daily broad spectrum sunscreen SPF 30+ to sun-exposed areas, reapply every 2 hours as needed.  - Call if any new or changing lesions are noted between office visits - Will go to every 6 months for TBSE after next visit.   Varicose Veins/Spider Veins - Dilated blue, purple or red veins at the lower extremities - Reassured - Smaller vessels can be treated by sclerotherapy (a procedure to inject a medicine into the veins to make them disappear) if desired, but the treatment is not covered by insurance. Larger vessels may be covered if symptomatic and we would refer to vascular surgeon if treatment desired.  - Patient having some pain from left hip down to leg after sitting for a while and during the night. - Recommend patient follow up with PCP  Return in about 4 months (around 01/27/2023) for TBSE, Hx MM.  Anise Salvo, RMA, am acting as scribe for Armida Sans, MD .   Documentation: I have reviewed the above documentation for accuracy and completeness, and I agree with the above.  Armida Sans, MD

## 2022-09-27 NOTE — Patient Instructions (Signed)
Melanoma ABCDEs  Melanoma is the most dangerous type of skin cancer, and is the leading cause of death from skin disease.  You are more likely to develop melanoma if you: Have light-colored skin, light-colored eyes, or red or blond hair Spend a lot of time in the sun Tan regularly, either outdoors or in a tanning bed Have had blistering sunburns, especially during childhood Have a close family member who has had a melanoma Have atypical moles or large birthmarks  Early detection of melanoma is key since treatment is typically straightforward and cure rates are extremely high if we catch it early.   The first sign of melanoma is often a change in a mole or a new dark spot.  The ABCDE system is a way of remembering the signs of melanoma.  A for asymmetry:  The two halves do not match. B for border:  The edges of the growth are irregular. C for color:  A mixture of colors are present instead of an even brown color. D for diameter:  Melanomas are usually (but not always) greater than 6mm - the size of a pencil eraser. E for evolution:  The spot keeps changing in size, shape, and color.  Please check your skin once per month between visits. You can use a small mirror in front and a large mirror behind you to keep an eye on the back side or your body.   If you see any new or changing lesions before your next follow-up, please call to schedule a visit.  Please continue daily skin protection including broad spectrum sunscreen SPF 30+ to sun-exposed areas, reapplying every 2 hours as needed when you're outdoors.    Due to recent changes in healthcare laws, you may see results of your pathology and/or laboratory studies on MyChart before the doctors have had a chance to review them. We understand that in some cases there may be results that are confusing or concerning to you. Please understand that not all results are received at the same time and often the doctors may need to interpret multiple  results in order to provide you with the best plan of care or course of treatment. Therefore, we ask that you please give us 2 business days to thoroughly review all your results before contacting the office for clarification. Should we see a critical lab result, you will be contacted sooner.   If You Need Anything After Your Visit  If you have any questions or concerns for your doctor, please call our main line at [phone] and press option 4 to reach your doctor's medical assistant. If no one answers, please leave a voicemail as directed and we will return your call as soon as possible. Messages left after 4 pm will be answered the following business day.   You may also send us a message via MyChart. We typically respond to MyChart messages within 1-2 business days.  For prescription refills, please ask your pharmacy to contact our office. Our fax number is [phone].  If you have an urgent issue when the clinic is closed that cannot wait until the next business day, you can page your doctor at the number below.    Please note that while we do our best to be available for urgent issues outside of office hours, we are not available 24/7.   If you have an urgent issue and are unable to reach us, you may choose to seek medical care at your doctor's office, retail clinic, urgent care   center, or emergency room.  If you have a medical emergency, please immediately call 911 or go to the emergency department.  Pager Numbers  - Dr. Kowalski: [phone]  - Dr. Moye: [phone]  - Dr. Stewart: [phone]  In the event of inclement weather, please call our main line at [phone] for an update on the status of any delays or closures.  Dermatology Medication Tips: Please keep the boxes that topical medications come in in order to help keep track of the instructions about where and how to use these. Pharmacies typically print the medication instructions only on the boxes and not  directly on the medication tubes.   If your medication is too expensive, please contact our office at [phone] option 4 or send us a message through MyChart.   We are unable to tell what your co-pay for medications will be in advance as this is different depending on your insurance coverage. However, we may be able to find a substitute medication at lower cost or fill out paperwork to get insurance to cover a needed medication.   If a prior authorization is required to get your medication covered by your insurance company, please allow us 1-2 business days to complete this process.  Drug prices often vary depending on where the prescription is filled and some pharmacies may offer cheaper prices.  The website www.goodrx.com contains coupons for medications through different pharmacies. The prices here do not account for what the cost may be with help from insurance (it may be cheaper with your insurance), but the website can give you the price if you did not use any insurance.  - You can print the associated coupon and take it with your prescription to the pharmacy.  - You may also stop by our office during regular business hours and pick up a GoodRx coupon card.  - If you need your prescription sent electronically to a different pharmacy, notify our office through [place] MyChart or by phone at [phone] option 4.     

## 2022-10-05 ENCOUNTER — Encounter: Payer: Self-pay | Admitting: Dermatology

## 2023-01-03 ENCOUNTER — Ambulatory Visit: Payer: BC Managed Care – PPO | Admitting: Dermatology

## 2023-01-03 VITALS — BP 116/79 | HR 64

## 2023-01-03 DIAGNOSIS — W908XXA Exposure to other nonionizing radiation, initial encounter: Secondary | ICD-10-CM | POA: Diagnosis not present

## 2023-01-03 DIAGNOSIS — L814 Other melanin hyperpigmentation: Secondary | ICD-10-CM

## 2023-01-03 DIAGNOSIS — L578 Other skin changes due to chronic exposure to nonionizing radiation: Secondary | ICD-10-CM

## 2023-01-03 DIAGNOSIS — Z85820 Personal history of malignant melanoma of skin: Secondary | ICD-10-CM

## 2023-01-03 DIAGNOSIS — D229 Melanocytic nevi, unspecified: Secondary | ICD-10-CM

## 2023-01-03 DIAGNOSIS — Z1283 Encounter for screening for malignant neoplasm of skin: Secondary | ICD-10-CM

## 2023-01-03 DIAGNOSIS — D1801 Hemangioma of skin and subcutaneous tissue: Secondary | ICD-10-CM | POA: Diagnosis not present

## 2023-01-03 DIAGNOSIS — L821 Other seborrheic keratosis: Secondary | ICD-10-CM

## 2023-01-03 NOTE — Patient Instructions (Addendum)
Recommend daily broad spectrum sunscreen SPF 30+ to sun-exposed areas, reapply every 2 hours as needed. Call for new or changing lesions.  Staying in the shade or wearing long sleeves, sun glasses (UVA+UVB protection) and wide brim hats (4-inch brim around the entire circumference of the hat) are also recommended for sun protection.      Due to recent changes in healthcare laws, you may see results of your pathology and/or laboratory studies on MyChart before the doctors have had a chance to review them. We understand that in some cases there may be results that are confusing or concerning to you. Please understand that not all results are received at the same time and often the doctors may need to interpret multiple results in order to provide you with the best plan of care or course of treatment. Therefore, we ask that you please give [place] 2 business days to thoroughly review all your results before contacting the office for clarification. Should we see a critical lab result, you will be contacted sooner.   If You Need Anything After Your Visit  If you have any questions or concerns for your doctor, please call our main line at [phone] and press option 4 to reach your doctor's medical assistant. If no one answers, please leave a voicemail as directed and we will return your call as soon as possible. Messages left after 4 pm will be answered the following business day.   You may also send [place] a message via MyChart. We typically respond to MyChart messages within 1-2 business days.  For prescription refills, please ask your pharmacy to contact our office. Our fax number is [phone].  If you have an urgent issue when the clinic is closed that cannot wait until the next business day, you can page your doctor at the number below.    Please note that while we do our best to be available for urgent issues outside of office hours, we are not available 24/7.   If you have an urgent issue and are  unable to reach [place], you may choose to seek medical care at your doctor's office, retail clinic, urgent care center, or emergency room.  If you have a medical emergency, please immediately call 911 or go to the emergency department.  Pager Numbers  - Dr. Gwen Pounds: [phone]  - Dr. Roseanne Reno: [phone]  In the event of inclement weather, please call our main line at [phone] for an update on the status of any delays or closures.  Dermatology Medication Tips: Please keep the boxes that topical medications come in in order to help keep track of the instructions about where and how to use these. Pharmacies typically print the medication instructions only on the boxes and not directly on the medication tubes.   If your medication is too expensive, please contact our office at [phone] option 4 or send [place] a message through MyChart.   We are unable to tell what your co-pay for medications will be in advance as this is different depending on your insurance coverage. However, we may be able to find a substitute medication at lower cost or fill out paperwork to get insurance to cover a needed medication.   If a prior authorization is required to get your medication covered by your insurance company, please allow [place] 1-2 business days to complete this process.  Drug prices often vary depending on where the prescription is filled and some pharmacies may offer cheaper prices.  The website www.goodrx.com contains coupons for  medications through different pharmacies. The prices here do not account for what the cost may be with help from insurance (it may be cheaper with your insurance), but the website can give you the price if you did not use any insurance.  - You can print the associated coupon and take it with your prescription to the pharmacy.  - You may also stop by our office during regular business hours and pick up a GoodRx coupon card.  - If you need your prescription sent  electronically to a different pharmacy, notify our office through [facility] or by phone at [phone] option 4.     Si Usted Necesita Algo Despus de Su Visita  Tambin puede enviarnos un mensaje a travs de Clinical cytogeneticist. Por lo general respondemos a los mensajes de MyChart en el transcurso de 1 a 2 das hbiles.  Para renovar recetas, por favor pida a su farmacia que se ponga en contacto con nuestra oficina. Annie Sable de fax es [place] [phone].  Si tiene un asunto urgente cuando la clnica est cerrada y que no puede esperar hasta el siguiente da hbil, puede llamar/localizar a su doctor(a) al nmero que aparece a continuacin.   Por favor, tenga en cuenta que aunque hacemos todo lo posible para estar disponibles para asuntos urgentes fuera del horario de [place], no estamos disponibles las 24 horas del da, los 7 [address]  [address] de la [place].   Si tiene un problema urgente y no puede comunicarse con nosotros, puede optar por buscar atencin mdica  en el consultorio de su doctor(a), en una clnica privada, en un centro de atencin urgente o en una sala de emergencias.  Si tiene Engineer, drilling, por favor llame inmediatamente al 911 o vaya a la sala de emergencias.  Nmeros de bper  - Dr. Gwen Pounds: [phone]  - Dra. Roseanne Reno: [phone]  En caso de inclemencias del [place], por favor llame a Lacy Duverney principal al [phone] para una actualizacin sobre el [place] de cualquier retraso o cierre.  Consejos para la medicacin en dermatologa: Por favor, guarde las cajas en las que vienen los medicamentos de uso tpico para ayudarle a seguir las instrucciones sobre dnde y cmo usarlos. Las farmacias generalmente imprimen las instrucciones del medicamento slo en las cajas y no directamente en los tubos del [place].   Si su medicamento es muy caro, por favor, pngase en contacto con Rolm Gala llamando al [phone] y presione la opcin 4 o envenos un mensaje a  travs de Clinical cytogeneticist.   No podemos decirle cul ser su copago por los medicamentos por adelantado ya que esto es diferente dependiendo de la cobertura de su seguro. Sin embargo, es posible que podamos encontrar un medicamento sustituto a Audiological scientist un formulario para que el seguro cubra el medicamento que se considera necesario.   Si se requiere una autorizacin previa para que su compaa de seguros [place] su medicamento, por favor permtanos de 1 a 2 das hbiles para completar [address]  Los precios de los medicamentos varan con frecuencia dependiendo del Environmental consultant de dnde se surte la receta y alguna farmacias pueden ofrecer precios ms baratos.  El sitio web www.goodrx.com tiene cupones para medicamentos de Health and safety inspector. Los precios aqu no tienen en cuenta lo que podra costar con la ayuda del seguro (puede ser ms barato con su seguro), pero el sitio web puede darle el precio si no utiliz Tourist information centre manager.  - Puede imprimir el cupn correspondiente y llevarlo con su receta a  la farmacia.  - Tambin puede pasar por nuestra oficina durante el horario de atencin regular y Education officer, museum una tarjeta de cupones de GoodRx.  - Si necesita que su receta se enve electrnicamente a una farmacia diferente, informe a nuestra oficina a travs de MyChart de [place] o por telfono llamando al [phone] y presione la opcin 4.

## 2023-01-03 NOTE — Progress Notes (Signed)
   Follow-Up Visit   Subjective  Billy Cruz is a 56 y.o. male who presents for the following: Skin Cancer Screening and Full Body Skin Exam, hx of Melanoma on the left forearm.   The patient presents for Total-Body Skin Exam (TBSE) for skin cancer screening and mole check. The patient has spots, moles and lesions to be evaluated, some may be new or changing and the patient may have concern these could be cancer.    The following portions of the chart were reviewed this encounter and updated as appropriate: medications, allergies, medical history  Review of Systems:  No other skin or systemic complaints except as noted in HPI or Assessment and Plan.  Objective  Well appearing patient in no apparent distress; mood and affect are within normal limits.  A full examination was performed including scalp, head, eyes, ears, nose, lips, neck, chest, axillae, abdomen, back, buttocks, bilateral upper extremities, bilateral lower extremities, hands, feet, fingers, toes, fingernails, and toenails. All findings within normal limits unless otherwise noted below.   Relevant physical exam findings are noted in the Assessment and Plan.   Assessment & Plan   SKIN CANCER SCREENING PERFORMED TODAY.  ACTINIC DAMAGE - Chronic condition, secondary to cumulative UV/sun exposure - diffuse scaly erythematous macules with underlying dyspigmentation - Recommend daily broad spectrum sunscreen SPF 30+ to sun-exposed areas, reapply every 2 hours as needed.  - Staying in the shade or wearing long sleeves, sun glasses (UVA+UVB protection) and wide brim hats (4-inch brim around the entire circumference of the hat) are also recommended for sun protection.  - Call for new or changing lesions.  LENTIGINES, SEBORRHEIC KERATOSES, HEMANGIOMAS - Benign normal skin lesions - Benign-appearing - Call for any changes  MELANOCYTIC NEVI - Tan-brown and/or pink-flesh-colored symmetric macules and papules - Benign  appearing on exam today - Observation - Call clinic for new or changing moles - Recommend daily use of broad spectrum spf 30+ sunscreen to sun-exposed areas.    HISTORY OF MELANOMA Left mid dorsal forearm 01/19/2021 - No evidence of recurrence today - No lymphadenopathy - Recommend regular full body skin exams - Recommend daily broad spectrum sunscreen SPF 30+ to sun-exposed areas, reapply every 2 hours as needed.  - Call if any new or changing lesions are noted between office visits    Return in about 6 months (around 07/06/2023) for TBSE, hx of Melanoma .  IAngelique Holm, CMA, am acting as scribe for Armida Sans, MD .   Documentation: I have reviewed the above documentation for accuracy and completeness, and I agree with the above.  Armida Sans, MD

## 2023-01-08 ENCOUNTER — Encounter: Payer: Self-pay | Admitting: Dermatology

## 2023-04-26 ENCOUNTER — Encounter: Payer: Self-pay | Admitting: Dermatology

## 2023-07-11 ENCOUNTER — Ambulatory Visit: Payer: BC Managed Care – PPO | Admitting: Dermatology

## 2023-08-06 ENCOUNTER — Other Ambulatory Visit: Payer: Self-pay | Admitting: Unknown Physician Specialty

## 2023-08-06 DIAGNOSIS — J385 Laryngeal spasm: Secondary | ICD-10-CM

## 2023-08-15 ENCOUNTER — Ambulatory Visit
Admission: RE | Admit: 2023-08-15 | Discharge: 2023-08-15 | Disposition: A | Discharge status: Home / Self Care | Source: Ambulatory Visit | Attending: Unknown Physician Specialty | Admitting: Unknown Physician Specialty

## 2023-08-15 DIAGNOSIS — J385 Laryngeal spasm: Secondary | ICD-10-CM | POA: Diagnosis present

## 2023-08-15 NOTE — Therapy (Signed)
 Modified Barium Swallow Study  Patient Details  Name: Billy Cruz MRN: [id number] Date of Birth: 1966/03/16  Today's Date: 08/15/2023  Modified Barium Swallow completed.  Full report located under Chart Review in the Imaging Section.  History of Present Illness 58 y.o. male presents today for MBSS in setting of laryngospasm. Pt with c/o difficulty globus sensation with thin liquids which has subsided since starting PPI.  Pt with PMHx GERD, HLD.   Clinical Impression Pt demonstrated an intact oropharyngeal swallow. During esophageal sweep, retention of barium noted with retrograde bolus flow below the level of the PES. Retained barium cleared with water. Otherwise, MBSS was unremarkable. Factors that may increase risk of adverse event in presence of aspiration Billy Cruz & Billy Cruz 2021): Respiratory or GI disease  Swallow Evaluation Recommendations Recommendations: PO diet PO Diet Recommendation: Regular;Thin liquids (Level 0) Liquid Administration via: Spoon;Cup;Straw Medication Administration:  (as tolerated) Supervision: Patient able to self-feed Swallowing strategies  : Follow solids with liquids Postural changes: Position pt fully upright for meals (upright 60-90 minutes after meals; reflux precautions) Oral care recommendations: Oral care BID (2x/day);Pt independent with oral care Recommended consults:  (ENT following)    Billy Cruz, M.S., CCC-SLP Speech-Language Pathologist [facility] [phone] Billy Cruz)   Billy Cruz 08/15/2023,2:10 PM

## 2023-10-24 ENCOUNTER — Encounter: Payer: Self-pay | Admitting: Dermatology

## 2023-10-24 ENCOUNTER — Ambulatory Visit: Admitting: Dermatology

## 2023-10-24 DIAGNOSIS — I781 Nevus, non-neoplastic: Secondary | ICD-10-CM

## 2023-10-24 DIAGNOSIS — D229 Melanocytic nevi, unspecified: Secondary | ICD-10-CM

## 2023-10-24 DIAGNOSIS — Z85820 Personal history of malignant melanoma of skin: Secondary | ICD-10-CM

## 2023-10-24 DIAGNOSIS — T1490XA Injury, unspecified, initial encounter: Secondary | ICD-10-CM

## 2023-10-24 DIAGNOSIS — L814 Other melanin hyperpigmentation: Secondary | ICD-10-CM | POA: Diagnosis not present

## 2023-10-24 DIAGNOSIS — Z7189 Other specified counseling: Secondary | ICD-10-CM

## 2023-10-24 DIAGNOSIS — W908XXA Exposure to other nonionizing radiation, initial encounter: Secondary | ICD-10-CM

## 2023-10-24 DIAGNOSIS — L821 Other seborrheic keratosis: Secondary | ICD-10-CM

## 2023-10-24 DIAGNOSIS — L578 Other skin changes due to chronic exposure to nonionizing radiation: Secondary | ICD-10-CM | POA: Diagnosis not present

## 2023-10-24 DIAGNOSIS — S50862A Insect bite (nonvenomous) of left forearm, initial encounter: Secondary | ICD-10-CM

## 2023-10-24 DIAGNOSIS — D1801 Hemangioma of skin and subcutaneous tissue: Secondary | ICD-10-CM

## 2023-10-24 DIAGNOSIS — Z1283 Encounter for screening for malignant neoplasm of skin: Secondary | ICD-10-CM

## 2023-10-24 DIAGNOSIS — R238 Other skin changes: Secondary | ICD-10-CM

## 2023-10-24 NOTE — Patient Instructions (Signed)

## 2023-10-24 NOTE — Progress Notes (Signed)
 Follow-Up Visit   Subjective  Billy Cruz is a 58 y.o. male who presents for the following: 4 months Skin Cancer Screening and Full Body Skin Exam, hx of Melanoma  The patient presents for Total-Body Skin Exam (TBSE) for skin cancer screening and mole check. The patient has spots, moles and lesions to be evaluated, some may be new or changing and the patient may have concern these could be cancer.  The following portions of the chart were reviewed this encounter and updated as appropriate: medications, allergies, medical history  Review of Systems:  No other skin or systemic complaints except as noted in HPI or Assessment and Plan.  Objective  Well appearing patient in no apparent distress; mood and affect are within normal limits.  A full examination was performed including scalp, head, eyes, ears, nose, lips, neck, chest, axillae, abdomen, back, buttocks, bilateral upper extremities, bilateral lower extremities, hands, feet, fingers, toes, fingernails, and toenails. All findings within normal limits unless otherwise noted below.   Relevant physical exam findings are noted in the Assessment and Plan.    Assessment & Plan   SKIN CANCER SCREENING PERFORMED TODAY.  ACTINIC DAMAGE - Chronic condition, secondary to cumulative UV/sun exposure - diffuse scaly erythematous macules with underlying dyspigmentation - Recommend daily broad spectrum sunscreen SPF 30+ to sun-exposed areas, reapply every 2 hours as needed.  - Staying in the shade or wearing long sleeves, sun glasses (UVA+UVB protection) and wide brim hats (4-inch brim around the entire circumference of the hat) are also recommended for sun protection.  - Call for new or changing lesions.  LENTIGINES, SEBORRHEIC KERATOSES, HEMANGIOMAS - Benign normal skin lesions - Benign-appearing - Call for any changes  MELANOCYTIC NEVI - Tan-brown and/or pink-flesh-colored symmetric macules and papules - Benign appearing on exam  today - Observation - Call clinic for new or changing moles - Recommend daily use of broad spectrum spf 30+ sunscreen to sun-exposed areas.   TELANGIECTASIA Exam: dilated blood vessel(s) Treatment Plan: Benign appearing on exam Call for changes  Counseling for BBL / IPL / Laser and Coordination of Care Discussed the treatment option of Broad Band Light (BBL) /Intense Pulsed Light (IPL)/ Laser for skin discoloration, including brown spots and redness.  Typically we recommend at least 1-3 treatment sessions about 5-8 weeks apart for best results.  Cannot have tanned skin when BBL performed, and regular use of sunscreen/photoprotection is advised after the procedure to help maintain results. The patient's condition may also require "maintenance treatments" in the future.  The fee for BBL / laser treatments is $350 per treatment session for the whole face.  A fee can be quoted for other parts of the body.  Insurance typically does not pay for BBL/laser treatments and therefore the fee is an out-of-pocket cost. Recommend prophylactic valtrex treatment. Once scheduled for procedure, will send Rx in prior to patient's appointment.     HISTORY OF MELANOMA Left mid dorsal forearm -01/2021 - No evidence of recurrence today - No lymphadenopathy - Recommend regular full body skin exams - Recommend daily broad spectrum sunscreen SPF 30+ to sun-exposed areas, reapply every 2 hours as needed.  - Call if any new or changing lesions are noted between office visits   BITE  Left mid dorsal forearm near MM scar  If not gone in 4-6 weeks return to the office  Observe  TRAUMA  Purpuric papule on the right foot sole If not gone in 4-6 weeks return to the office Observe  Photo  taken   Return in about 6 months (around 04/25/2024) for TBSE, hx of Melanoma.  IClara Crisp, CMA, am acting as scribe for Celine Collard, MD .   Documentation: I have reviewed the above documentation for accuracy and  completeness, and I agree with the above.  Celine Collard, MD

## 2023-10-31 ENCOUNTER — Ambulatory Visit: Payer: BC Managed Care – PPO | Admitting: Dermatology

## 2024-04-21 ENCOUNTER — Encounter: Payer: Self-pay | Admitting: Dermatology

## 2024-04-21 ENCOUNTER — Ambulatory Visit: Admitting: Dermatology

## 2024-04-21 DIAGNOSIS — L821 Other seborrheic keratosis: Secondary | ICD-10-CM

## 2024-04-21 DIAGNOSIS — Z85820 Personal history of malignant melanoma of skin: Secondary | ICD-10-CM

## 2024-04-21 DIAGNOSIS — W908XXA Exposure to other nonionizing radiation, initial encounter: Secondary | ICD-10-CM | POA: Diagnosis not present

## 2024-04-21 DIAGNOSIS — L814 Other melanin hyperpigmentation: Secondary | ICD-10-CM

## 2024-04-21 DIAGNOSIS — Z1283 Encounter for screening for malignant neoplasm of skin: Secondary | ICD-10-CM | POA: Diagnosis not present

## 2024-04-21 DIAGNOSIS — D229 Melanocytic nevi, unspecified: Secondary | ICD-10-CM

## 2024-04-21 DIAGNOSIS — L578 Other skin changes due to chronic exposure to nonionizing radiation: Secondary | ICD-10-CM

## 2024-04-21 DIAGNOSIS — W57XXXA Bitten or stung by nonvenomous insect and other nonvenomous arthropods, initial encounter: Secondary | ICD-10-CM

## 2024-04-21 DIAGNOSIS — D1801 Hemangioma of skin and subcutaneous tissue: Secondary | ICD-10-CM

## 2024-04-21 NOTE — Patient Instructions (Signed)

## 2024-04-21 NOTE — Progress Notes (Unsigned)
   Follow-Up Visit   Subjective  Billy Cruz is a 58 y.o. male who presents for the following: Skin Cancer Screening and Full Body Skin Exam hx of Melanoma, AKs  The patient presents for Total-Body Skin Exam (TBSE) for skin cancer screening and mole check. The patient has spots, moles and lesions to be evaluated, some may be new or changing and the patient may have concern these could be cancer.  The following portions of the chart were reviewed this encounter and updated as appropriate: medications, allergies, medical history  Review of Systems:  No other skin or systemic complaints except as noted in HPI or Assessment and Plan.  Objective  Well appearing patient in no apparent distress; mood and affect are within normal limits.  A full examination was performed including scalp, head, eyes, ears, nose, lips, neck, chest, axillae, abdomen, back, buttocks, bilateral upper extremities, bilateral lower extremities, hands, feet, fingers, toes, fingernails, and toenails. All findings within normal limits unless otherwise noted below.   Relevant physical exam findings are noted in the Assessment and Plan.    Assessment & Plan   SKIN CANCER SCREENING PERFORMED TODAY.  ACTINIC DAMAGE - Chronic condition, secondary to cumulative UV/sun exposure - diffuse scaly erythematous macules with underlying dyspigmentation - Recommend daily broad spectrum sunscreen SPF 30+ to sun-exposed areas, reapply every 2 hours as needed.  - Staying in the shade or wearing long sleeves, sun glasses (UVA+UVB protection) and wide brim hats (4-inch brim around the entire circumference of the hat) are also recommended for sun protection.  - Call for new or changing lesions.  LENTIGINES, SEBORRHEIC KERATOSES, HEMANGIOMAS - Benign normal skin lesions - Benign-appearing - Call for any changes  MELANOCYTIC NEVI - Tan-brown and/or pink-flesh-colored symmetric macules and papules - Benign appearing on exam  today - Observation - Call clinic for new or changing moles - Recommend daily use of broad spectrum spf 30+ sunscreen to sun-exposed areas.   HISTORY OF MELANOMA - No evidence of recurrence today - No lymphadenopathy - Recommend regular full body skin exams - Recommend daily broad spectrum sunscreen SPF 30+ to sun-exposed areas, reapply every 2 hours as needed.  - Call if any new or changing lesions are noted between office visits  - L mid dorsal forearm, excised 02/28/21  RESOLVING BITE REACTION L lat knee area Exam: edematous pink papule Treatment Plan: Benign, observe.   Recommend OTC 1% hydrocortisone cream 1-2 times daily to affected area as needed for itch   Return in about 6 months (around 10/19/2024) for TBSE, Hx of Melanoma, Hx of AKs.  I, Grayce Saunas, RMA, am acting as scribe for Alm Rhyme, MD .   Documentation: I have reviewed the above documentation for accuracy and completeness, and I agree with the above.  Alm Rhyme, MD

## 2024-04-22 ENCOUNTER — Encounter: Payer: Self-pay | Admitting: Dermatology

## 2024-07-09 ENCOUNTER — Ambulatory Visit
Admission: RE | Admit: 2024-07-09 | Discharge: 2024-07-09 | Disposition: A | Source: Ambulatory Visit | Attending: Internal Medicine | Admitting: Internal Medicine

## 2024-07-09 ENCOUNTER — Other Ambulatory Visit: Payer: Self-pay | Admitting: Internal Medicine

## 2024-07-09 DIAGNOSIS — N200 Calculus of kidney: Secondary | ICD-10-CM

## 2024-10-14 ENCOUNTER — Ambulatory Visit: Admitting: Dermatology
